# Patient Record
Sex: Female | Born: 1950 | Race: White | Hispanic: No | Marital: Married | Smoking: Never smoker
Health system: Southern US, Community
[De-identification: ages and names within clinical notes are randomized; demographics above are authoritative.]

## PROBLEM LIST (undated history)

## (undated) DIAGNOSIS — L719 Rosacea, unspecified: Secondary | ICD-10-CM

## (undated) DIAGNOSIS — M1990 Unspecified osteoarthritis, unspecified site: Secondary | ICD-10-CM

## (undated) DIAGNOSIS — E7800 Pure hypercholesterolemia, unspecified: Secondary | ICD-10-CM

## (undated) DIAGNOSIS — K219 Gastro-esophageal reflux disease without esophagitis: Secondary | ICD-10-CM

## (undated) DIAGNOSIS — M7070 Other bursitis of hip, unspecified hip: Secondary | ICD-10-CM

## (undated) DIAGNOSIS — M5126 Other intervertebral disc displacement, lumbar region: Secondary | ICD-10-CM

## (undated) DIAGNOSIS — M722 Plantar fascial fibromatosis: Secondary | ICD-10-CM

## (undated) DIAGNOSIS — G20C Parkinsonism, unspecified: Secondary | ICD-10-CM

## (undated) DIAGNOSIS — M51369 Other intervertebral disc degeneration, lumbar region without mention of lumbar back pain or lower extremity pain: Secondary | ICD-10-CM

## (undated) DIAGNOSIS — M797 Fibromyalgia: Secondary | ICD-10-CM

## (undated) DIAGNOSIS — L570 Actinic keratosis: Secondary | ICD-10-CM

## (undated) DIAGNOSIS — E039 Hypothyroidism, unspecified: Secondary | ICD-10-CM

## (undated) DIAGNOSIS — Z79890 Hormone replacement therapy (postmenopausal): Secondary | ICD-10-CM

## (undated) DIAGNOSIS — M5136 Other intervertebral disc degeneration, lumbar region: Secondary | ICD-10-CM

## (undated) HISTORY — DX: Parkinsonism, unspecified: G20.C

## (undated) HISTORY — DX: Unspecified osteoarthritis, unspecified site: M19.90

## (undated) HISTORY — DX: Other bursitis of hip, unspecified hip: M70.70

## (undated) HISTORY — DX: Other intervertebral disc degeneration, lumbar region: M51.36

## (undated) HISTORY — DX: Hormone replacement therapy: Z79.890

## (undated) HISTORY — DX: Other intervertebral disc displacement, lumbar region: M51.26

## (undated) HISTORY — PX: CHOLECYSTECTOMY: SHX55

## (undated) HISTORY — DX: Rosacea, unspecified: L71.9

## (undated) HISTORY — PX: TONSILLECTOMY: SUR1361

## (undated) HISTORY — DX: Actinic keratosis: L57.0

## (undated) HISTORY — DX: Other intervertebral disc degeneration, lumbar region without mention of lumbar back pain or lower extremity pain: M51.369

## (undated) HISTORY — DX: Plantar fascial fibromatosis: M72.2

---

## 2005-07-17 ENCOUNTER — Other Ambulatory Visit: Admission: RE | Admit: 2005-07-17 | Discharge: 2005-07-17 | Payer: Self-pay | Admitting: Obstetrics and Gynecology

## 2007-07-27 ENCOUNTER — Other Ambulatory Visit: Admission: RE | Admit: 2007-07-27 | Discharge: 2007-07-27 | Payer: Self-pay | Admitting: Obstetrics and Gynecology

## 2008-07-30 ENCOUNTER — Other Ambulatory Visit: Admission: RE | Admit: 2008-07-30 | Discharge: 2008-07-30 | Payer: Self-pay | Admitting: Obstetrics and Gynecology

## 2009-07-31 ENCOUNTER — Other Ambulatory Visit: Admission: RE | Admit: 2009-07-31 | Discharge: 2009-07-31 | Payer: Self-pay | Admitting: Obstetrics and Gynecology

## 2010-08-01 ENCOUNTER — Other Ambulatory Visit: Admission: RE | Admit: 2010-08-01 | Discharge: 2010-08-01 | Payer: Self-pay | Admitting: Obstetrics and Gynecology

## 2011-08-18 ENCOUNTER — Other Ambulatory Visit (HOSPITAL_COMMUNITY)
Admission: RE | Admit: 2011-08-18 | Discharge: 2011-08-18 | Disposition: A | Discharge status: Home / Self Care | Payer: PRIVATE HEALTH INSURANCE | Source: Ambulatory Visit | Attending: Obstetrics and Gynecology | Admitting: Obstetrics and Gynecology

## 2011-08-18 DIAGNOSIS — Z01419 Encounter for gynecological examination (general) (routine) without abnormal findings: Secondary | ICD-10-CM | POA: Insufficient documentation

## 2012-08-15 ENCOUNTER — Encounter (INDEPENDENT_AMBULATORY_CARE_PROVIDER_SITE_OTHER): Payer: Self-pay | Admitting: *Deleted

## 2012-08-16 ENCOUNTER — Encounter (INDEPENDENT_AMBULATORY_CARE_PROVIDER_SITE_OTHER): Payer: Self-pay

## 2012-08-22 ENCOUNTER — Other Ambulatory Visit (HOSPITAL_COMMUNITY)
Admission: RE | Admit: 2012-08-22 | Discharge: 2012-08-22 | Disposition: A | Discharge status: Home / Self Care | Payer: PRIVATE HEALTH INSURANCE | Source: Ambulatory Visit | Attending: Obstetrics and Gynecology | Admitting: Obstetrics and Gynecology

## 2012-08-22 DIAGNOSIS — Z1151 Encounter for screening for human papillomavirus (HPV): Secondary | ICD-10-CM | POA: Insufficient documentation

## 2012-08-22 DIAGNOSIS — Z01419 Encounter for gynecological examination (general) (routine) without abnormal findings: Secondary | ICD-10-CM | POA: Insufficient documentation

## 2012-09-20 ENCOUNTER — Other Ambulatory Visit (INDEPENDENT_AMBULATORY_CARE_PROVIDER_SITE_OTHER): Payer: Self-pay | Admitting: *Deleted

## 2012-09-20 ENCOUNTER — Telehealth (INDEPENDENT_AMBULATORY_CARE_PROVIDER_SITE_OTHER): Payer: Self-pay | Admitting: *Deleted

## 2012-09-20 DIAGNOSIS — Z800 Family history of malignant neoplasm of digestive organs: Secondary | ICD-10-CM

## 2012-09-20 DIAGNOSIS — Z1211 Encounter for screening for malignant neoplasm of colon: Secondary | ICD-10-CM

## 2012-09-20 MED ORDER — PEG-KCL-NACL-NASULF-NA ASC-C 100 G PO SOLR: 1.0000 | Freq: Once | ORAL | Status: DC

## 2012-09-20 NOTE — Telephone Encounter (Signed)
Patient needs movi prep 

## 2012-10-12 ENCOUNTER — Telehealth (INDEPENDENT_AMBULATORY_CARE_PROVIDER_SITE_OTHER): Payer: Self-pay | Admitting: *Deleted

## 2012-10-12 NOTE — Telephone Encounter (Signed)
  Procedure: tcs  Reason/Indication:  fam hx colon ca  Has patient had this procedure before?  Yes, 2008 (scanned)  If so, when, by whom and where?    Is there a family history of colon cancer?  Yes, brother  Who?  What age when diagnosed?    Is patient diabetic?   no      Does patient have prosthetic heart valve?  no  Do you have a pacemaker?  no  Has patient had joint replacement within last 12 months?  no  Is patient on Coumadin, Plavix and/or Aspirin? no  Medications: levothyroxine .05 mg daily, zantac 150 mg bid  Allergies: sulfur, crestor  Medication Adjustment:   Procedure date & time: 11/10/12 at 730

## 2012-10-12 NOTE — Telephone Encounter (Signed)
agree

## 2012-11-01 ENCOUNTER — Encounter (HOSPITAL_COMMUNITY): Payer: Self-pay | Admitting: Pharmacy Technician

## 2012-11-10 ENCOUNTER — Encounter (HOSPITAL_COMMUNITY)
Admission: RE | Disposition: A | Discharge status: Home Health | Payer: Self-pay | Source: Ambulatory Visit | Attending: Internal Medicine

## 2012-11-10 ENCOUNTER — Encounter (HOSPITAL_COMMUNITY): Payer: Self-pay

## 2012-11-10 ENCOUNTER — Ambulatory Visit (HOSPITAL_COMMUNITY)
Admission: RE | Admit: 2012-11-10 | Discharge: 2012-11-10 | Disposition: A | Discharge status: Home Health | Payer: PRIVATE HEALTH INSURANCE | Source: Ambulatory Visit | Attending: Internal Medicine | Admitting: Internal Medicine

## 2012-11-10 DIAGNOSIS — Z800 Family history of malignant neoplasm of digestive organs: Secondary | ICD-10-CM

## 2012-11-10 DIAGNOSIS — K644 Residual hemorrhoidal skin tags: Secondary | ICD-10-CM | POA: Insufficient documentation

## 2012-11-10 DIAGNOSIS — K5730 Diverticulosis of large intestine without perforation or abscess without bleeding: Secondary | ICD-10-CM

## 2012-11-10 DIAGNOSIS — E785 Hyperlipidemia, unspecified: Secondary | ICD-10-CM | POA: Insufficient documentation

## 2012-11-10 DIAGNOSIS — Z1211 Encounter for screening for malignant neoplasm of colon: Secondary | ICD-10-CM | POA: Insufficient documentation

## 2012-11-10 HISTORY — DX: Gastro-esophageal reflux disease without esophagitis: K21.9

## 2012-11-10 HISTORY — PX: COLONOSCOPY: SHX5424

## 2012-11-10 HISTORY — DX: Pure hypercholesterolemia, unspecified: E78.00

## 2012-11-10 HISTORY — DX: Hypothyroidism, unspecified: E03.9

## 2012-11-10 HISTORY — DX: Fibromyalgia: M79.7

## 2012-11-10 SURGERY — COLONOSCOPY
Anesthesia: Moderate Sedation

## 2012-11-10 MED ORDER — STERILE WATER FOR IRRIGATION IR SOLN
Status: DC | PRN
  Administered 2012-11-10: 08:00:00

## 2012-11-10 MED ORDER — MEPERIDINE HCL 50 MG/ML IJ SOLN
INTRAMUSCULAR | Status: DC | PRN
  Administered 2012-11-10: 25 mg via INTRAVENOUS

## 2012-11-10 MED ORDER — MEPERIDINE HCL 50 MG/ML IJ SOLN
INTRAMUSCULAR | Status: AC
  Filled 2012-11-10: qty 1

## 2012-11-10 MED ORDER — SODIUM CHLORIDE 0.45 % IV SOLN
INTRAVENOUS | Status: DC
  Administered 2012-11-10: 07:00:00 via INTRAVENOUS

## 2012-11-10 MED ORDER — MIDAZOLAM HCL 5 MG/5ML IJ SOLN
INTRAMUSCULAR | Status: DC | PRN
  Administered 2012-11-10 (×2): 2 mg via INTRAVENOUS
  Administered 2012-11-10: 1 mg via INTRAVENOUS

## 2012-11-10 MED ORDER — MIDAZOLAM HCL 5 MG/5ML IJ SOLN
INTRAMUSCULAR | Status: AC
  Filled 2012-11-10: qty 10

## 2012-11-10 NOTE — H&P (Signed)
Natalie Morrow is an 62 y.o. female.   Chief Complaint: Patient is here for colonoscopy. HPI: Patient is 62 year old Caucasian female who is here for screening colonoscopy. Family history is positive for colon carcinoma in a brother who is 6 at the time of diagnosis and doing well in his 31s. She denies abdominal pain change in her bowel habits or rectal bleeding.  Past Medical History  Diagnosis Date  . Hypothyroidism   . Hypercholesteremia   . GERD (gastroesophageal reflux disease)   . Fibromyalgia     Past Surgical History  Procedure Laterality Date  . Cholecystectomy    . Tonsillectomy      History reviewed. No pertinent family history. Social History:  reports that she has never smoked. She does not have any smokeless tobacco history on file. She reports that she does not drink alcohol or use illicit drugs.  Allergies:  Allergies  Allergen Reactions  . Crestor (Rosuvastatin) Itching  . Sulfa Antibiotics Swelling    Medications Prior to Admission  Medication Sig Dispense Refill  . clonazePAM (KLONOPIN) 0.5 MG tablet Take 0.5 mg by mouth at bedtime as needed (Nerve Pain).      Marland Kitchen estradiol (CLIMARA - DOSED IN MG/24 HR) 0.025 mg/24hr Place 1 patch onto the skin once a week. Changes on Saturday      . ibuprofen (ADVIL,MOTRIN) 200 MG tablet Take 400 mg by mouth every 6 (six) hours as needed for pain.      Marland Kitchen levothyroxine (SYNTHROID, LEVOTHROID) 50 MCG tablet Take 50 mcg by mouth daily.      . peg 3350 powder (MOVIPREP) 100 G SOLR Take 1 kit (100 g total) by mouth once.  1 kit  0  . ranitidine (ZANTAC) 150 MG tablet Take 150 mg by mouth 2 (two) times daily.        No results found for this or any previous visit (from the past 48 hour(s)). No results found.  ROS  Blood pressure 134/78, pulse 109, temperature 98.2 F (36.8 C), temperature source Oral, resp. rate 13, height 5\' 6"  (1.676 m), weight 150 lb (68.04 kg), SpO2 96.00%. Physical Exam  Constitutional: She appears  well-developed and well-nourished.  HENT:  Mouth/Throat: Oropharynx is clear and moist.  Eyes: Conjunctivae are normal. No scleral icterus.  Neck: No thyromegaly present.  Cardiovascular: Normal rate, regular rhythm and normal heart sounds.   No murmur heard. Respiratory: Effort normal and breath sounds normal.  GI: Soft. She exhibits no distension and no mass. There is no tenderness.  Musculoskeletal: She exhibits no edema.  Lymphadenopathy:    She has no cervical adenopathy.  Neurological: She is alert.  Skin: Skin is dry.     Assessment/Plan High-risk screening colonoscopy. Family history of colon carcinoma in a sibling at age 65  Natalie Morrow 11/10/2012, 7:35 AM

## 2012-11-10 NOTE — Op Note (Signed)
COLONOSCOPY PROCEDURE REPORT  PATIENT:  [name]  MR#:  [id number] Birthdate:  [birth year], 62 y.o., female Endoscopist:  Dr. Malissa Hippo, MD Referred By:  Dr. Fara Chute, MD  Procedure Date: 11/10/2012  Procedure:   Colonoscopy  Indications:  Patient is 62 year old Caucasian female who is undergoing high-risk screening colonoscopy. Patient's last exam was in December 2008. Her brother had surgery for colon carcinoma at age 65 and doing fine in his 16s.  Informed Consent:  The procedure and risks were reviewed with the patient and informed consent was obtained.  Medications:  Demerol 25 mg IV Versed 5 mg IV  Description of procedure:  After a digital rectal exam was performed, that colonoscope was advanced from the anus through the rectum and colon to the area of the cecum, ileocecal valve and appendiceal orifice. The cecum was deeply intubated. These structures were well-seen and photographed for the record. From the level of the cecum and ileocecal valve, the scope was slowly and cautiously withdrawn. The mucosal surfaces were carefully surveyed utilizing scope tip to flexion to facilitate fold flattening as needed. The scope was pulled down into the rectum where a thorough exam including retroflexion was performed.  Findings:   Prep excellent. Few small diverticula at sigmoid colon. Normal rectal mucosa. Small hemorrhoids below the dentate line.  Therapeutic/Diagnostic Maneuvers Performed:  None  Complications:  None  Cecal Withdrawal Time:  9 minutes  Impression:  Examination performed to cecum. Few small diverticula at sigmoid colon and external hemorrhoids otherwise normal examination.  Recommendations:  Standard instructions given. Next screening exam in 5 years.  Ziad Maye U  11/10/2012 8:05 AM  CC: Dr. Estanislado Pandy, MD & Dr. Bonnetta Barry ref. provider found

## 2012-11-14 ENCOUNTER — Encounter (HOSPITAL_COMMUNITY): Payer: Self-pay | Admitting: Internal Medicine

## 2013-09-05 ENCOUNTER — Ambulatory Visit (INDEPENDENT_AMBULATORY_CARE_PROVIDER_SITE_OTHER): Payer: PRIVATE HEALTH INSURANCE | Admitting: Adult Health

## 2013-09-05 ENCOUNTER — Encounter: Payer: Self-pay | Admitting: Adult Health

## 2013-09-05 VITALS — BP 120/78 | HR 76 | Ht 66.0 in | Wt 156.0 lb

## 2013-09-05 DIAGNOSIS — Z01419 Encounter for gynecological examination (general) (routine) without abnormal findings: Secondary | ICD-10-CM

## 2013-09-05 DIAGNOSIS — Z79890 Hormone replacement therapy (postmenopausal): Secondary | ICD-10-CM | POA: Insufficient documentation

## 2013-09-05 DIAGNOSIS — Z1212 Encounter for screening for malignant neoplasm of rectum: Secondary | ICD-10-CM

## 2013-09-05 HISTORY — DX: Hormone replacement therapy: Z79.890

## 2013-09-05 LAB — HEMOCCULT GUIAC POC 1CARD (OFFICE)

## 2013-09-05 MED ORDER — ESTRADIOL-LEVONORGESTREL 0.045-0.015 MG/DAY TD PTWK: 1.0000 | MEDICATED_PATCH | TRANSDERMAL | Status: DC

## 2013-09-05 NOTE — Progress Notes (Signed)
Patient ID: Natalie Morrow, female   DOB: [birth year], 62 y.o.   MRN: [id number] History of Present Illness: Natalie Morrow is a 62 year old white female,married in for a physical,she had a normal pap with negative HPV 08/22/12.   Current Medications, Allergies, Past Medical History, Past Surgical History, Family History and Social History were reviewed in [name].   Past Medical History  Diagnosis Date  . Hypothyroidism   . Hypercholesteremia   . GERD (gastroesophageal reflux disease)   . Fibromyalgia   . Hormone replacement therapy (HRT) 09/05/2013   Past Surgical History  Procedure Laterality Date  . Cholecystectomy    . Tonsillectomy    . Colonoscopy N/A 11/10/2012    Procedure: COLONOSCOPY;  Surgeon: Malissa Hippo, MD;  Location: [facility];  Service: Endoscopy;  Laterality: N/A;  730  Current outpatient prescriptions:clonazePAM (KLONOPIN) 0.5 MG tablet, Take 0.5 mg by mouth at bedtime as needed (Nerve Pain)., Disp: , Rfl: ;  estradiol-levonorgestrel (CLIMARAPRO) 0.045-0.015 MG/DAY, Place 1 patch onto the skin once a week., Disp: 4 patch, Rfl: 11;  ibuprofen (ADVIL,MOTRIN) 200 MG tablet, Take 400 mg by mouth every 6 (six) hours as needed for pain., Disp: , Rfl:  levothyroxine (SYNTHROID, LEVOTHROID) 50 MCG tablet, Take 50 mcg by mouth daily., Disp: , Rfl: ;  ranitidine (ZANTAC) 150 MG tablet, Take 150 mg by mouth 2 (two) times daily., Disp: , Rfl:   Review of Systems: Patient denies any headaches, blurred vision, shortness of breath, chest pain, abdominal pain, problems with bowel movements, urination, or intercourse. Does have some vaginal dryness.Has body aches no joint swelling, no mood swings.    Physical Exam:BP 120/78  Pulse 76  Ht 5\' 6"  (1.676 m)  Wt 156 lb (70.761 kg)  BMI 25.19 kg/m2 General:  Well developed, well nourished, no acute distress Skin:  Warm and dry Neck:  Midline trachea, normal thyroid, no carotid bruits Lungs; Clear to  auscultation bilaterally Breast:  No dominant palpable mass, retraction, or nipple discharge Cardiovascular: Regular rate and rhythm Abdomen:  Soft, non tender, no hepatosplenomegaly Pelvic:  External genitalia is normal in appearance.  The vagina is normal in appearance for age.               The cervix is smooth.  Uterus is felt to be normal size, shape, and contour.  No adnexal masses or tenderness noted. Rectal: Good sphincter tone, no polyps, or hemorrhoids felt.  Hemoccult negative. Extremities:  No swelling or varicosities noted Psych:  No mood changes, alert and cooperative, seems happy Discussed try to come off the patch after the holidays but if has hot flashes could stay on longer  Impression: Yearly well woman gyn exam HRT    Plan: Physical in 1 year Mammogram yearly  Colonoscopy 2019 Labs with PCP Try luvena for vaginal moisture

## 2013-09-05 NOTE — Patient Instructions (Signed)
Physical in 1 year Mammogram yearly  Colonoscopy 2019 Labs with PCP Try luvena for vaginal moisture

## 2013-09-08 ENCOUNTER — Other Ambulatory Visit: Payer: Self-pay | Admitting: Adult Health

## 2013-09-20 ENCOUNTER — Encounter: Payer: Self-pay | Admitting: Adult Health

## 2014-07-16 ENCOUNTER — Encounter: Payer: Self-pay | Admitting: Adult Health

## 2014-09-11 ENCOUNTER — Ambulatory Visit (INDEPENDENT_AMBULATORY_CARE_PROVIDER_SITE_OTHER): Payer: PRIVATE HEALTH INSURANCE | Admitting: Adult Health

## 2014-09-11 ENCOUNTER — Encounter: Payer: Self-pay | Admitting: Obstetrics and Gynecology

## 2014-09-11 ENCOUNTER — Encounter: Payer: Self-pay | Admitting: Adult Health

## 2014-09-11 VITALS — BP 118/80 | HR 74 | Ht 66.0 in | Wt 159.0 lb

## 2014-09-11 DIAGNOSIS — Z1212 Encounter for screening for malignant neoplasm of rectum: Secondary | ICD-10-CM

## 2014-09-11 DIAGNOSIS — Z01419 Encounter for gynecological examination (general) (routine) without abnormal findings: Secondary | ICD-10-CM

## 2014-09-11 LAB — HEMOCCULT GUIAC POC 1CARD (OFFICE): Fecal Occult Blood, POC: NEGATIVE

## 2014-09-11 NOTE — Patient Instructions (Signed)
Pap and physical in 1 year Mammogram yearly Labs with PCP 

## 2014-09-11 NOTE — Progress Notes (Signed)
Patient ID: Natalie Morrow, female   DOB: [birth year], 63 y.o.   MRN: [id number] History of Present Illness: Natalie Morrow is a 63 year old white female, married in for a gyn physical.She had a normal pap with negative HPV 08/22/12.She is having pain in back that radiates down left leg and sees Dr Laurian Brim'Toole.She also had some palpitations but they are better now,she saw PCP.   Current Medications, Allergies, Past Medical History, Past Surgical History, Family History and Social History were reviewed in [facility] electronic medical record.     Review of Systems: Patient denies any headaches, blurred vision, shortness of breath, chest pain, abdominal pain, problems with bowel movements, urination, or intercourse. No joint pain or mood swings, she stopped her HRT patch in January.See HPI.    Physical Exam:BP 118/80 mmHg  Pulse 74  Ht 5\' 6"  (1.676 m)  Wt 159 lb (72.122 kg)  BMI 25.68 kg/m2 General:  Well developed, well nourished, no acute distress Skin:  Warm and dry Neck:  Midline trachea, normal thyroid, no carotid bruits heard Lungs; Clear to auscultation bilaterally Breast:  No dominant palpable mass, retraction, or nipple discharge Cardiovascular: Regular rate and rhythm Abdomen:  Soft, non tender, no hepatosplenomegaly Pelvic:  External genitalia is normal in appearance, no lesions.  The vagina has loss of color, moisture and rugae.  The cervix is smooth.  Uterus is felt to be normal size, shape, and contour.  No                adnexal masses or tenderness noted. Rectal: Good sphincter tone, no polyps, or hemorrhoids felt.  Hemoccult negative. Extremities:  No swelling or varicosities noted Psych:  No mood changes,alert and cooperative,seems happy   Impression: Well woman gyn exam no pap    Plan: Pap and physical in 1 year Mammogram yearly Labs with PCP Keep Dr Laurian Brim'Toole aware of any changes with leg and back, and if has any bowel or bladder issues

## 2014-09-24 ENCOUNTER — Ambulatory Visit: Payer: PRIVATE HEALTH INSURANCE | Admitting: Obstetrics and Gynecology

## 2014-09-24 ENCOUNTER — Other Ambulatory Visit: Payer: PRIVATE HEALTH INSURANCE

## 2015-05-16 HISTORY — PX: BACK SURGERY: SHX140

## 2015-09-17 ENCOUNTER — Other Ambulatory Visit (HOSPITAL_COMMUNITY)
Admission: RE | Admit: 2015-09-17 | Discharge: 2015-09-17 | Disposition: A | Discharge status: Home / Self Care | Payer: PRIVATE HEALTH INSURANCE | Source: Ambulatory Visit | Attending: Adult Health | Admitting: Adult Health

## 2015-09-17 ENCOUNTER — Ambulatory Visit (INDEPENDENT_AMBULATORY_CARE_PROVIDER_SITE_OTHER): Payer: PRIVATE HEALTH INSURANCE | Admitting: Adult Health

## 2015-09-17 ENCOUNTER — Encounter: Payer: Self-pay | Admitting: Adult Health

## 2015-09-17 VITALS — BP 112/78 | HR 92 | Ht 65.5 in | Wt 157.0 lb

## 2015-09-17 DIAGNOSIS — Z1151 Encounter for screening for human papillomavirus (HPV): Secondary | ICD-10-CM | POA: Diagnosis present

## 2015-09-17 DIAGNOSIS — Z1212 Encounter for screening for malignant neoplasm of rectum: Secondary | ICD-10-CM | POA: Diagnosis not present

## 2015-09-17 DIAGNOSIS — Z01419 Encounter for gynecological examination (general) (routine) without abnormal findings: Secondary | ICD-10-CM

## 2015-09-17 DIAGNOSIS — L570 Actinic keratosis: Secondary | ICD-10-CM

## 2015-09-17 HISTORY — DX: Actinic keratosis: L57.0

## 2015-09-17 LAB — HEMOCCULT GUIAC POC 1CARD (OFFICE): Fecal Occult Blood, POC: NEGATIVE

## 2015-09-17 NOTE — Patient Instructions (Signed)
Mammogram yearly Physical in 1 year Colonoscopy per Dr Burton Apleyehman Labs with PCP

## 2015-09-17 NOTE — Progress Notes (Signed)
Patient ID: Stephan MinisterFrances H Doucet, female   DOB: Jun 10, 1951, 65 y.o.   MRN: [id number] History of Present Illness: Scarlette CalicoFrances is a 65 year old white female,married in for a well woman gyn exam and pap, she had back surgery in September by Dr Trey SailorsMark Roy.Her PCP is Dr Neita CarpSasser.   Current Medications, Allergies, Past Medical History, Past Surgical History, Family History and Social History were reviewed in [facility] electronic medical record.     Review of Systems: Patient denies any headaches, hearing loss, fatigue, blurred vision, shortness of breath, chest pain, abdominal pain, problems with bowel movements, urination, or intercourse. No joint pain or mood swings.She has some trouble sleeping at times, had before surgery due to pain,but now it is harder to get to sleep.    Physical Exam:BP 112/78 mmHg  Pulse 92  Ht 5' 5.5" (1.664 m)  Wt 157 lb (71.215 kg)  BMI 25.72 kg/m2 General:  Well developed, well nourished, no acute distress Skin:  Warm and dry,has numerous AKs Neck:  Midline trachea, normal thyroid, good ROM, no lymphadenopathy, no carotid bruits heard Lungs; Clear to auscultation bilaterally Breast:  No dominant palpable mass, retraction, or nipple discharge Cardiovascular: Regular rate and rhythm Abdomen:  Soft, non tender, no hepatosplenomegaly Pelvic:  External genitalia is normal in appearance, no lesions.  The vagina has decreased color, moisture and rugae. Urethra has no lesions or masses. The cervix is atrophic, pap with HPV performed.Marland Kitchen.  Uterus is felt to be normal size, shape, and contour.  No adnexal masses or tenderness noted.Bladder is non tender, no masses felt. Rectal: Good sphincter tone, no polyps, or hemorrhoids felt.  Hemoccult negative. Extremities/musculoskeletal:  No swelling or varicosities noted, no clubbing or cyanosis Psych:  No mood changes, alert and cooperative,seems happy   Impression: Well woman gyn exam with pap AKs    Plan: Try melatonin 10 mg at  bedtime Physical in 1 year, pap in 3 if normal  Mammogram yearly Labs with PCP  Colonoscopy per Dr Karilyn Cotaehman, had in 2014 due 2019  See Dr Orvan FalconerBeavers about AKs

## 2015-09-19 LAB — CYTOLOGY - PAP

## 2016-03-05 ENCOUNTER — Encounter: Payer: Self-pay | Admitting: Obstetrics and Gynecology

## 2016-09-24 ENCOUNTER — Other Ambulatory Visit: Payer: Self-pay | Admitting: Adult Health

## 2016-09-24 DIAGNOSIS — Z1231 Encounter for screening mammogram for malignant neoplasm of breast: Secondary | ICD-10-CM

## 2016-10-01 ENCOUNTER — Ambulatory Visit (HOSPITAL_COMMUNITY): Payer: PRIVATE HEALTH INSURANCE

## 2016-10-07 ENCOUNTER — Encounter: Payer: Self-pay | Admitting: Adult Health

## 2016-10-07 ENCOUNTER — Telehealth: Payer: Self-pay | Admitting: Adult Health

## 2016-10-07 ENCOUNTER — Ambulatory Visit (INDEPENDENT_AMBULATORY_CARE_PROVIDER_SITE_OTHER): Payer: PRIVATE HEALTH INSURANCE | Admitting: Adult Health

## 2016-10-07 ENCOUNTER — Ambulatory Visit (HOSPITAL_COMMUNITY)
Admission: RE | Admit: 2016-10-07 | Discharge: 2016-10-07 | Disposition: A | Discharge status: Home / Self Care | Payer: PRIVATE HEALTH INSURANCE | Source: Ambulatory Visit | Attending: Adult Health | Admitting: Adult Health

## 2016-10-07 VITALS — BP 104/60 | HR 78 | Ht 65.25 in | Wt 144.0 lb

## 2016-10-07 DIAGNOSIS — N898 Other specified noninflammatory disorders of vagina: Secondary | ICD-10-CM

## 2016-10-07 DIAGNOSIS — Z1212 Encounter for screening for malignant neoplasm of rectum: Secondary | ICD-10-CM

## 2016-10-07 DIAGNOSIS — Z1211 Encounter for screening for malignant neoplasm of colon: Secondary | ICD-10-CM | POA: Diagnosis not present

## 2016-10-07 DIAGNOSIS — Z1231 Encounter for screening mammogram for malignant neoplasm of breast: Secondary | ICD-10-CM | POA: Diagnosis present

## 2016-10-07 DIAGNOSIS — Z01419 Encounter for gynecological examination (general) (routine) without abnormal findings: Secondary | ICD-10-CM

## 2016-10-07 DIAGNOSIS — L570 Actinic keratosis: Secondary | ICD-10-CM

## 2016-10-07 DIAGNOSIS — Z01411 Encounter for gynecological examination (general) (routine) with abnormal findings: Secondary | ICD-10-CM | POA: Diagnosis not present

## 2016-10-07 LAB — HEMOCCULT GUIAC POC 1CARD (OFFICE): FECAL OCCULT BLD: NEGATIVE

## 2016-10-07 NOTE — Telephone Encounter (Signed)
Left message that mammogram was good

## 2016-10-07 NOTE — Progress Notes (Signed)
Patient ID: Natalie Morrow, female   DOB: 03/23/1951, 66 y.o.   MRN: [id number] History of Present Illness: Natalie Morrow is a 66 year old white female, married in for well woman gyn exam, she had normal pap with negative HPV 09/17/15. PCP is Dr Neita CarpSasser.  Current Medications, Allergies, Past Medical History, Past Surgical History, Family History and Social History were reviewed in [facility] electronic medical record.     Review of Systems: Patient denies any headaches, hearing loss, fatigue, blurred vision, shortness of breath, chest pain, abdominal pain, problems with bowel movements, urination(leaks at times), or intercourse(burns some). No joint pain(has plantar fascitis)  or mood swings.    Physical Exam:BP 104/60 (BP Location: Left Arm, Patient Position: Sitting, Cuff Size: Normal)   Pulse 78   Ht 5' 5.25" (1.657 m)   Wt 144 lb (65.3 kg)   BMI 23.78 kg/m  General:  Well developed, well nourished, no acute distress Skin:  Warm and dry,+AKs Neck:  Midline trachea, normal thyroid, good ROM, no lymphadenopathy,no carotid bruits heard Lungs; Clear to auscultation bilaterally Breast:  No dominant palpable mass, retraction, or nipple discharge Cardiovascular: Regular rate and rhythm Abdomen:  Soft, non tender, no hepatosplenomegaly Pelvic:  External genitalia is normal in appearance, no lesions.  The vagina is pale with loss of moisture and rugae. Urethra has no lesions or masses. The cervix is smooth.  Uterus is felt to be normal size, shape, and contour.  No adnexal masses or tenderness noted.Bladder is non tender, no masses felt. Rectal: Good sphincter tone, no polyps, or hemorrhoids felt.  Hemoccult negative. Extremities/musculoskeletal:  No swelling or varicosities noted, no clubbing or cyanosis Psych:  No mood changes, alert and cooperative,seems happy PHQ 2 score 0  Impression: 1. Well female exam with routine gynecological exam   2. AK (actinic keratosis)   3. Vaginal dryness    4. Screening for colorectal cancer       Plan:  Physical in 1 year Pap in 2020 Mammogram yearly Labs with PCP Talk with PCP about 81 mg baby ASA daily and take centrum silver daily Try luvena Colonoscopy per GI She has had flu shot, and pneumonia shot and got shingles shot at 59 encouraged to get new shingles shot.She has had DEXA with Dr Trey SailorsMark Roy, may have osteopenia.

## 2017-08-24 ENCOUNTER — Other Ambulatory Visit: Payer: Self-pay | Admitting: Adult Health

## 2017-08-24 DIAGNOSIS — Z1231 Encounter for screening mammogram for malignant neoplasm of breast: Secondary | ICD-10-CM

## 2017-10-12 ENCOUNTER — Ambulatory Visit (INDEPENDENT_AMBULATORY_CARE_PROVIDER_SITE_OTHER): Payer: Commercial Managed Care - PPO | Admitting: Adult Health

## 2017-10-12 ENCOUNTER — Other Ambulatory Visit: Payer: Self-pay

## 2017-10-12 ENCOUNTER — Encounter: Payer: Self-pay | Admitting: Adult Health

## 2017-10-12 VITALS — BP 118/66 | HR 97 | Ht 66.0 in | Wt 139.0 lb

## 2017-10-12 DIAGNOSIS — N816 Rectocele: Secondary | ICD-10-CM

## 2017-10-12 DIAGNOSIS — Z1212 Encounter for screening for malignant neoplasm of rectum: Secondary | ICD-10-CM

## 2017-10-12 DIAGNOSIS — Z1211 Encounter for screening for malignant neoplasm of colon: Secondary | ICD-10-CM

## 2017-10-12 DIAGNOSIS — Z01419 Encounter for gynecological examination (general) (routine) without abnormal findings: Secondary | ICD-10-CM | POA: Diagnosis not present

## 2017-10-12 DIAGNOSIS — L570 Actinic keratosis: Secondary | ICD-10-CM | POA: Diagnosis not present

## 2017-10-12 DIAGNOSIS — N898 Other specified noninflammatory disorders of vagina: Secondary | ICD-10-CM | POA: Diagnosis not present

## 2017-10-12 HISTORY — DX: Other specified noninflammatory disorders of vagina: N89.8

## 2017-10-12 HISTORY — DX: Rectocele: N81.6

## 2017-10-12 LAB — HEMOCCULT GUIAC POC 1CARD (OFFICE): FECAL OCCULT BLD: NEGATIVE

## 2017-10-12 NOTE — Progress Notes (Signed)
Patient ID: Natalie Morrow, female   DOB: [birth year], 67 y.o.   MRN: [id number] History of Present Illness: Natalie CalicoFrances is a 67 year old white female, married, PM in for well woman gyn exam, she had normal pap with negative 09/17/15. PCP is Dr Neita CarpSasser.    Current Medications, Allergies, Past Medical History, Past Surgical History, Family History and Social History were reviewed in [facility] electronic medical record.     Review of Systems:  Patient denies any headaches, hearing loss, fatigue, blurred vision, shortness of breath, chest pain, abdominal pain, problems with bowel movements, urination, or intercourse. No joint pain or mood swings. +vaginal dryness, uses luvena and astro glide,has brown patch on face,that burns at times     Physical Exam:BP 118/66 (BP Location: Right Arm, Patient Position: Sitting, Cuff Size: Normal)   Pulse 97   Ht 5\' 6"  (1.676 m)   Wt 139 lb (63 kg)   BMI 22.44 kg/m  General:  Well developed, well nourished, no acute distress Skin:  Warm and dry,has 2 brown patches on cheeks, R>L, has seen dermatologist,has Aks on back  Neck:  Midline trachea, normal thyroid, good ROM, no lymphadenopathy,no carotid bruits heard Lungs; Clear to auscultation bilaterally Breast:  No dominant palpable mass, retraction, or nipple discharge Cardiovascular: Regular rate and rhythm Abdomen:  Soft, non tender, no hepatosplenomegaly Pelvic:  External genitalia is normal in appearance, no lesions.  The vagina is pale with loss of moisture and rugae. Urethra has no lesions or masses. The cervix is smooth.  Uterus is felt to be normal size, shape, and contour.  No adnexal masses or tenderness noted.Bladder is non tender, no masses felt. Rectal: Good sphincter tone, no polyps, or hemorrhoids felt.  Hemoccult negative.+rectocele Extremities/musculoskeletal:  No swelling or varicosities noted, no clubbing or cyanosis Psych:  No mood changes, alert and cooperative,seems happy PHQ 2  score 0.  Impression: 1. Well female exam with routine gynecological exam   2. Screening for colorectal cancer   3. Vaginal dryness   4. Rectocele   5. AK (actinic keratosis)       Plan: Pap and physical in 1 year Mammogram yearly F/U with dermatologist

## 2017-10-14 ENCOUNTER — Ambulatory Visit (HOSPITAL_COMMUNITY): Payer: PRIVATE HEALTH INSURANCE

## 2017-10-21 ENCOUNTER — Ambulatory Visit (HOSPITAL_COMMUNITY)
Admission: RE | Admit: 2017-10-21 | Discharge: 2017-10-21 | Disposition: A | Discharge status: Home / Self Care | Payer: Commercial Managed Care - PPO | Source: Ambulatory Visit | Attending: Adult Health | Admitting: Adult Health

## 2017-10-21 DIAGNOSIS — Z1231 Encounter for screening mammogram for malignant neoplasm of breast: Secondary | ICD-10-CM | POA: Diagnosis not present

## 2017-12-01 ENCOUNTER — Encounter (INDEPENDENT_AMBULATORY_CARE_PROVIDER_SITE_OTHER): Payer: Self-pay | Admitting: *Deleted

## 2018-02-16 ENCOUNTER — Other Ambulatory Visit (INDEPENDENT_AMBULATORY_CARE_PROVIDER_SITE_OTHER): Payer: Self-pay | Admitting: *Deleted

## 2018-02-16 DIAGNOSIS — Z800 Family history of malignant neoplasm of digestive organs: Secondary | ICD-10-CM

## 2018-02-17 ENCOUNTER — Other Ambulatory Visit (INDEPENDENT_AMBULATORY_CARE_PROVIDER_SITE_OTHER): Payer: Self-pay | Admitting: *Deleted

## 2018-02-17 ENCOUNTER — Encounter (INDEPENDENT_AMBULATORY_CARE_PROVIDER_SITE_OTHER): Payer: Self-pay | Admitting: *Deleted

## 2018-02-17 ENCOUNTER — Telehealth (INDEPENDENT_AMBULATORY_CARE_PROVIDER_SITE_OTHER): Payer: Self-pay | Admitting: *Deleted

## 2018-02-17 DIAGNOSIS — Z800 Family history of malignant neoplasm of digestive organs: Secondary | ICD-10-CM

## 2018-02-17 HISTORY — DX: Family history of malignant neoplasm of digestive organs: Z80.0

## 2018-02-17 NOTE — Telephone Encounter (Signed)
Patient needs trilyte 

## 2018-02-21 MED ORDER — PEG 3350-KCL-NA BICARB-NACL 420 G PO SOLR: 4000.0000 mL | Freq: Once | ORAL | 0 refills | Status: AC

## 2018-03-10 ENCOUNTER — Telehealth (INDEPENDENT_AMBULATORY_CARE_PROVIDER_SITE_OTHER): Payer: Self-pay | Admitting: *Deleted

## 2018-03-10 NOTE — Telephone Encounter (Signed)
Referring MD/PCP: sasser   Procedure: tcs with propofol  Reason/Indication:  fam hx colon ca  Has patient had this procedure before?  Yes, 2014  If so, when, by whom and where?    Is there a family history of colon cancer?  Yes, brother  Who?  What age when diagnosed?    Is patient diabetic?   no      Does patient have prosthetic heart valve or mechanical valve?  no  Do you have a pacemaker?  no  Has patient ever had endocarditis? no  Has patient had joint replacement within last 12 months?  no  Is patient constipated or do they take laxatives? no  Does patient have a history of alcohol/drug use?  no  Is patient on blood thinner such as Coumadin, Plavix and/or Aspirin? no  Medications: levothyroxine 50 mcg daily, doxycycline 50 mg daily, metronidazole 0.75% cream, clonazepam 0.5 mg 1/2 tab at bedtime, tylenol, Claritin or rantidine    Allergies: sulfur, statins  Medication Adjustment per Dr Keane Policeehman/Terri Setzer, NP:   Procedure date & time: 04/08/18 at 1:00

## 2018-03-10 NOTE — Telephone Encounter (Signed)
agree

## 2018-04-01 ENCOUNTER — Other Ambulatory Visit (HOSPITAL_COMMUNITY): Payer: Commercial Managed Care - PPO

## 2018-04-01 MED ORDER — CHLORHEXIDINE GLUCONATE CLOTH 2 % EX PADS: 6.0000 | MEDICATED_PAD | Freq: Once | CUTANEOUS | Status: DC

## 2018-04-01 NOTE — Patient Instructions (Signed)
Natalie Morrow  04/01/2018     @PREFPERIOPPHARMACY @   Your procedure is scheduled on  04/08/2018 .  Report to [facility] at  1130   A.M.  Call this number if you have problems the morning of surgery:  [phone]   Remember:  Do not eat or drink after midnight.  You may drink clear liquids until ( follow the diet and prep instructions given to you by Dr Patty Sermons office) .  Clear liquids allowed are:                    Water, Juice (non-citric and without pulp), Carbonated beverages, Clear Tea, Black Coffee only, Plain Jell-O only, Gatorade and Plain Popsicles only    Take these medicines the morning of surgery with A SIP OF WATER  Levothyroxine, claritin, zantac.    Do not wear jewelry, make-up or nail polish.  Do not wear lotions, powders, or perfumes, or deodorant.  Do not shave 48 hours prior to surgery.  Men may shave face and neck.  Do not bring valuables to the hospital.  [facility] is not responsible for any belongings or valuables.  Contacts, dentures or bridgework may not be worn into surgery.  Leave your suitcase in the car.  After surgery it may be brought to your room.  For patients admitted to the hospital, discharge time will be determined by your treatment team.  Patients discharged the day of surgery will not be allowed to drive home.   Name and phone number of your driver:   family Special instructions:  Follow the diet and prep instructions given to you by Dr Patty Sermons office.  Please read over the following fact sheets that you were given. Anesthesia Post-op Instructions and Care and Recovery After Surgery       Colonoscopy, Adult A colonoscopy is an exam to look at the large intestine. It is done to check for problems, such as:  Lumps (tumors).  Growths (polyps).  Swelling (inflammation).  Bleeding.  What happens before the procedure? Eating and drinking Follow instructions from your doctor about eating and drinking. These  instructions may include:  A few days before the procedure - follow a low-fiber diet. ? Avoid nuts. ? Avoid seeds. ? Avoid dried fruit. ? Avoid raw fruits. ? Avoid vegetables.  1-3 days before the procedure - follow a clear liquid diet. Avoid liquids that have red or purple dye. Drink only clear liquids, such as: ? Clear broth or bouillon. ? Black coffee or tea. ? Clear juice. ? Clear soft drinks or sports drinks. ? Gelatin dessert. ? Popsicles.  On the day of the procedure - do not eat or drink anything during the 2 hours before the procedure.  Bowel prep If you were prescribed an oral bowel prep:  Take it as told by your doctor. Starting the day before your procedure, you will need to drink a lot of liquid. The liquid will cause you to poop (have bowel movements) until your poop is almost clear or light green.  If your skin or butt gets irritated from diarrhea, you may: ? Wipe the area with wipes that have medicine in them, such as adult wet wipes with aloe and vitamin E. ? Put something on your skin that soothes the area, such as petroleum jelly.  If you throw up (vomit) while drinking the bowel prep, take a break for up to 60 minutes. Then begin the  bowel prep again. If you keep throwing up and you cannot take the bowel prep without throwing up, call your doctor.  General instructions  Ask your doctor about changing or stopping your normal medicines. This is important if you take diabetes medicines or blood thinners.  Plan to have someone take you home from the hospital or clinic. What happens during the procedure?  An IV tube may be put into one of your veins.  You will be given medicine to help you relax (sedative).  To reduce your risk of infection: ? Your doctors will wash their hands. ? Your anal area will be washed with soap.  You will be asked to lie on your side with your knees bent.  Your doctor will get a long, thin, flexible tube ready. The tube will have  a camera and a light on the end.  The tube will be put into your anus.  The tube will be gently put into your large intestine.  Air will be delivered into your large intestine to keep it open. You may feel some pressure or cramping.  The camera will be used to take photos.  A small tissue sample may be removed from your body to be looked at under a microscope (biopsy). If any possible problems are found, the tissue will be sent to a lab for testing.  If small growths are found, your doctor may remove them and have them checked for cancer.  The tube that was put into your anus will be slowly removed. The procedure may vary among doctors and hospitals. What happens after the procedure?  Your doctor will check on you often until the medicines you were given have worn off.  Do not drive for 24 hours after the procedure.  You may have a small amount of blood in your poop.  You may pass gas.  You may have mild cramps or bloating in your belly (abdomen).  It is up to you to get the results of your procedure. Ask your doctor, or the department performing the procedure, when your results will be ready. This information is not intended to replace advice given to you by your health care provider. Make sure you discuss any questions you have with your health care provider. Document Released: 10/03/2010 Document Revised: 07/01/2016 Document Reviewed: 11/12/2015 Elsevier Interactive Patient Education  2017 [employer]  Colonoscopy, Adult, Care After This sheet gives you information about how to care for yourself after your procedure. Your health care provider may also give you more specific instructions. If you have problems or questions, contact your health care provider. What can I expect after the procedure? After the procedure, it is common to have:  A small amount of blood in your stool for 24 hours after the procedure.  Some gas.  Mild abdominal cramping or bloating.  Follow  these instructions at home: General instructions   For the first 24 hours after the procedure: ? Do not drive or use machinery. ? Do not sign important documents. ? Do not drink alcohol. ? Do your regular daily activities at a slower pace than normal. ? Eat soft, easy-to-digest foods. ? Rest often.  Take over-the-counter or prescription medicines only as told by your health care provider.  It is up to you to get the results of your procedure. Ask your health care provider, or the department performing the procedure, when your results will be ready. Relieving cramping and bloating  Try walking around when you have cramps or feel  bloated.  Apply heat to your abdomen as told by your health care provider. Use a heat source that your health care provider recommends, such as a moist heat pack or a heating pad. ? Place a towel between your skin and the heat source. ? Leave the heat on for 20-30 minutes. ? Remove the heat if your skin turns bright red. This is especially important if you are unable to feel pain, heat, or cold. You may have a greater risk of getting burned. Eating and drinking  Drink enough fluid to keep your urine clear or pale yellow.  Resume your normal diet as instructed by your health care provider. Avoid heavy or fried foods that are hard to digest.  Avoid drinking alcohol for as long as instructed by your health care provider. Contact a health care provider if:  You have blood in your stool 2-3 days after the procedure. Get help right away if:  You have more than a small spotting of blood in your stool.  You pass large blood clots in your stool.  Your abdomen is swollen.  You have nausea or vomiting.  You have a fever.  You have increasing abdominal pain that is not relieved with medicine. This information is not intended to replace advice given to you by your health care provider. Make sure you discuss any questions you have with your health care  provider. Document Released: 04/14/2004 Document Revised: 05/25/2016 Document Reviewed: 11/12/2015 Elsevier Interactive Patient Education  2018 [employer]  Monitored Anesthesia Care Anesthesia is a term that refers to techniques, procedures, and medicines that help a person stay safe and comfortable during a medical procedure. Monitored anesthesia care, or sedation, is one type of anesthesia. Your anesthesia specialist may recommend sedation if you will be having a procedure that does not require you to be unconscious, such as:  Cataract surgery.  A dental procedure.  A biopsy.  A colonoscopy.  During the procedure, you may receive a medicine to help you relax (sedative). There are three levels of sedation:  Mild sedation. At this level, you may feel awake and relaxed. You will be able to follow directions.  Moderate sedation. At this level, you will be sleepy. You may not remember the procedure.  Deep sedation. At this level, you will be asleep. You will not remember the procedure.  The more medicine you are given, the deeper your level of sedation will be. Depending on how you respond to the procedure, the anesthesia specialist may change your level of sedation or the type of anesthesia to fit your needs. An anesthesia specialist will monitor you closely during the procedure. Let your health care provider know about:  Any allergies you have.  All medicines you are taking, including vitamins, herbs, eye drops, creams, and over-the-counter medicines.  Any use of steroids (by mouth or as a cream).  Any problems you or family members have had with sedatives and anesthetic medicines.  Any blood disorders you have.  Any surgeries you have had.  Any medical conditions you have, such as sleep apnea.  Whether you are pregnant or may be pregnant.  Any use of cigarettes, alcohol, or street drugs. What are the risks? Generally, this is a safe procedure. However, problems may  occur, including:  Getting too much medicine (oversedation).  Nausea.  Allergic reaction to medicines.  Trouble breathing. If this happens, a breathing tube may be used to help with breathing. It will be removed when you are awake and breathing on  your own.  Heart trouble.  Lung trouble.  Before the procedure Staying hydrated Follow instructions from your health care provider about hydration, which may include:  Up to 2 hours before the procedure - you may continue to drink clear liquids, such as water, clear fruit juice, black coffee, and plain tea.  Eating and drinking restrictions Follow instructions from your health care provider about eating and drinking, which may include:  8 hours before the procedure - stop eating heavy meals or foods such as meat, fried foods, or fatty foods.  6 hours before the procedure - stop eating light meals or foods, such as toast or cereal.  6 hours before the procedure - stop drinking milk or drinks that contain milk.  2 hours before the procedure - stop drinking clear liquids.  Medicines Ask your health care provider about:  Changing or stopping your regular medicines. This is especially important if you are taking diabetes medicines or blood thinners.  Taking medicines such as aspirin and ibuprofen. These medicines can thin your blood. Do not take these medicines before your procedure if your health care provider instructs you not to.  Tests and exams  You will have a physical exam.  You may have blood tests done to show: ? How well your kidneys and liver are working. ? How well your blood can clot.  General instructions  Plan to have someone take you home from the hospital or clinic.  If you will be going home right after the procedure, plan to have someone with you for 24 hours.  What happens during the procedure?  Your blood pressure, heart rate, breathing, level of pain and overall condition will be monitored.  An IV  tube will be inserted into one of your veins.  Your anesthesia specialist will give you medicines as needed to keep you comfortable during the procedure. This may mean changing the level of sedation.  The procedure will be performed. After the procedure  Your blood pressure, heart rate, breathing rate, and blood oxygen level will be monitored until the medicines you were given have worn off.  Do not drive for 24 hours if you received a sedative.  You may: ? Feel sleepy, clumsy, or nauseous. ? Feel forgetful about what happened after the procedure. ? Have a sore throat if you had a breathing tube during the procedure. ? Vomit. This information is not intended to replace advice given to you by your health care provider. Make sure you discuss any questions you have with your health care provider. Document Released: 05/27/2005 Document Revised: 02/07/2016 Document Reviewed: 12/22/2015 Elsevier Interactive Patient Education  2018 [employer] Monitored Anesthesia Care, Care After These instructions provide you with information about caring for yourself after your procedure. Your health care provider may also give you more specific instructions. Your treatment has been planned according to current medical practices, but problems sometimes occur. Call your health care provider if you have any problems or questions after your procedure. What can I expect after the procedure? After your procedure, it is common to:  Feel sleepy for several hours.  Feel clumsy and have poor balance for several hours.  Feel forgetful about what happened after the procedure.  Have poor judgment for several hours.  Feel nauseous or vomit.  Have a sore throat if you had a breathing tube during the procedure.  Follow these instructions at home: For at least 24 hours after the procedure:   Do not: ? Participate in activities in which  you could fall or become injured. ? Drive. ? Use heavy  machinery. ? Drink alcohol. ? Take sleeping pills or medicines that cause drowsiness. ? Make important decisions or sign legal documents. ? Take care of children on your own.  Rest. Eating and drinking  Follow the diet that is recommended by your health care provider.  If you vomit, drink water, juice, or soup when you can drink without vomiting.  Make sure you have little or no nausea before eating solid foods. General instructions  Have a responsible adult stay with you until you are awake and alert.  Take over-the-counter and prescription medicines only as told by your health care provider.  If you smoke, do not smoke without supervision.  Keep all follow-up visits as told by your health care provider. This is important. Contact a health care provider if:  You keep feeling nauseous or you keep vomiting.  You feel light-headed.  You develop a rash.  You have a fever. Get help right away if:  You have trouble breathing. This information is not intended to replace advice given to you by your health care provider. Make sure you discuss any questions you have with your health care provider. Document Released: 12/22/2015 Document Revised: 04/22/2016 Document Reviewed: 12/22/2015 Elsevier Interactive Patient Education  [employer].

## 2018-04-04 ENCOUNTER — Other Ambulatory Visit: Payer: Self-pay

## 2018-04-04 ENCOUNTER — Encounter (HOSPITAL_COMMUNITY)
Admission: RE | Admit: 2018-04-04 | Discharge: 2018-04-04 | Disposition: A | Discharge status: Home / Self Care | Payer: Commercial Managed Care - PPO | Source: Ambulatory Visit | Attending: Internal Medicine | Admitting: Internal Medicine

## 2018-04-04 ENCOUNTER — Encounter (HOSPITAL_COMMUNITY): Payer: Self-pay

## 2018-04-04 DIAGNOSIS — Z01818 Encounter for other preprocedural examination: Secondary | ICD-10-CM | POA: Diagnosis not present

## 2018-04-04 DIAGNOSIS — Z800 Family history of malignant neoplasm of digestive organs: Secondary | ICD-10-CM | POA: Diagnosis not present

## 2018-04-04 DIAGNOSIS — Z01810 Encounter for preprocedural cardiovascular examination: Secondary | ICD-10-CM | POA: Diagnosis present

## 2018-04-04 LAB — CBC WITH DIFFERENTIAL/PLATELET
Basophils Absolute: 0 10*3/uL (ref 0.0–0.1)
Basophils Relative: 0 %
EOS ABS: 0.2 10*3/uL (ref 0.0–0.7)
Eosinophils Relative: 3 %
HCT: 43.5 % (ref 36.0–46.0)
HEMOGLOBIN: 14.7 g/dL (ref 12.0–15.0)
LYMPHS ABS: 2.3 10*3/uL (ref 0.7–4.0)
LYMPHS PCT: 35 %
MCH: 31.1 pg (ref 26.0–34.0)
MCHC: 33.8 g/dL (ref 30.0–36.0)
MCV: 92 fL (ref 78.0–100.0)
Monocytes Absolute: 0.6 10*3/uL (ref 0.1–1.0)
Monocytes Relative: 8 %
NEUTROS PCT: 54 %
Neutro Abs: 3.6 10*3/uL (ref 1.7–7.7)
Platelets: 277 10*3/uL (ref 150–400)
RBC: 4.73 MIL/uL (ref 3.87–5.11)
RDW: 12.5 % (ref 11.5–15.5)
WBC: 6.7 10*3/uL (ref 4.0–10.5)

## 2018-04-04 LAB — BASIC METABOLIC PANEL
Anion gap: 9 (ref 5–15)
BUN: 15 mg/dL (ref 8–23)
CHLORIDE: 109 mmol/L (ref 98–111)
CO2: 24 mmol/L (ref 22–32)
Calcium: 8.9 mg/dL (ref 8.9–10.3)
Creatinine, Ser: 0.85 mg/dL (ref 0.44–1.00)
GFR calc Af Amer: >60 mL/min (ref >60)
GFR calc non Af Amer: >60 mL/min (ref >60)
Glucose, Bld: 97 mg/dL (ref 70–99)
POTASSIUM: 3.9 mmol/L (ref 3.5–5.1)
SODIUM: 142 mmol/L (ref 135–145)

## 2018-04-08 ENCOUNTER — Encounter (HOSPITAL_COMMUNITY): Payer: Self-pay

## 2018-04-08 ENCOUNTER — Ambulatory Visit (HOSPITAL_COMMUNITY)
Admission: RE | Admit: 2018-04-08 | Discharge: 2018-04-08 | Disposition: A | Discharge status: Home / Self Care | Payer: Commercial Managed Care - PPO | Source: Ambulatory Visit | Attending: Internal Medicine | Admitting: Internal Medicine

## 2018-04-08 ENCOUNTER — Encounter (HOSPITAL_COMMUNITY)
Admission: RE | Disposition: A | Discharge status: Home / Self Care | Payer: Self-pay | Source: Ambulatory Visit | Attending: Internal Medicine

## 2018-04-08 ENCOUNTER — Ambulatory Visit (HOSPITAL_COMMUNITY): Payer: Commercial Managed Care - PPO | Admitting: Anesthesiology

## 2018-04-08 ENCOUNTER — Other Ambulatory Visit: Payer: Self-pay

## 2018-04-08 DIAGNOSIS — E7800 Pure hypercholesterolemia, unspecified: Secondary | ICD-10-CM | POA: Diagnosis not present

## 2018-04-08 DIAGNOSIS — E039 Hypothyroidism, unspecified: Secondary | ICD-10-CM | POA: Diagnosis not present

## 2018-04-08 DIAGNOSIS — Z882 Allergy status to sulfonamides status: Secondary | ICD-10-CM | POA: Insufficient documentation

## 2018-04-08 DIAGNOSIS — K219 Gastro-esophageal reflux disease without esophagitis: Secondary | ICD-10-CM | POA: Insufficient documentation

## 2018-04-08 DIAGNOSIS — Z1211 Encounter for screening for malignant neoplasm of colon: Secondary | ICD-10-CM | POA: Diagnosis present

## 2018-04-08 DIAGNOSIS — K644 Residual hemorrhoidal skin tags: Secondary | ICD-10-CM | POA: Insufficient documentation

## 2018-04-08 DIAGNOSIS — K5730 Diverticulosis of large intestine without perforation or abscess without bleeding: Secondary | ICD-10-CM

## 2018-04-08 DIAGNOSIS — Z800 Family history of malignant neoplasm of digestive organs: Secondary | ICD-10-CM | POA: Diagnosis not present

## 2018-04-08 DIAGNOSIS — M797 Fibromyalgia: Secondary | ICD-10-CM | POA: Insufficient documentation

## 2018-04-08 DIAGNOSIS — Z79899 Other long term (current) drug therapy: Secondary | ICD-10-CM | POA: Insufficient documentation

## 2018-04-08 DIAGNOSIS — Z888 Allergy status to other drugs, medicaments and biological substances status: Secondary | ICD-10-CM | POA: Insufficient documentation

## 2018-04-08 DIAGNOSIS — Z8249 Family history of ischemic heart disease and other diseases of the circulatory system: Secondary | ICD-10-CM | POA: Diagnosis not present

## 2018-04-08 HISTORY — PX: COLONOSCOPY WITH PROPOFOL: SHX5780

## 2018-04-08 SURGERY — COLONOSCOPY WITH PROPOFOL
Anesthesia: Monitor Anesthesia Care

## 2018-04-08 MED ORDER — CHLORHEXIDINE GLUCONATE CLOTH 2 % EX PADS: 6.0000 | MEDICATED_PAD | Freq: Once | CUTANEOUS | Status: DC

## 2018-04-08 MED ORDER — MIDAZOLAM HCL 5 MG/5ML IJ SOLN
INTRAMUSCULAR | Status: DC | PRN
  Administered 2018-04-08: 2 mg via INTRAVENOUS

## 2018-04-08 MED ORDER — MIDAZOLAM HCL 2 MG/2ML IJ SOLN
INTRAMUSCULAR | Status: AC
  Filled 2018-04-08: qty 2

## 2018-04-08 MED ORDER — PROPOFOL 500 MG/50ML IV EMUL
INTRAVENOUS | Status: DC | PRN
  Administered 2018-04-08: 125 ug/kg/min via INTRAVENOUS

## 2018-04-08 MED ORDER — ONDANSETRON HCL 4 MG/2ML IJ SOLN: 4.0000 mg | Freq: Once | INTRAMUSCULAR | Status: DC | PRN

## 2018-04-08 MED ORDER — LACTATED RINGERS IV SOLN
INTRAVENOUS | Status: DC
  Administered 2018-04-08: 13:00:00 via INTRAVENOUS

## 2018-04-08 MED ORDER — PROPOFOL 500 MG/50ML IV EMUL: INTRAVENOUS | Status: DC | PRN

## 2018-04-08 MED ORDER — PROPOFOL 10 MG/ML IV BOLUS
INTRAVENOUS | Status: AC
  Filled 2018-04-08: qty 40

## 2018-04-08 NOTE — Discharge Instructions (Signed)
Resume usual medications as before. High-fiber diet. No driving for 24 hours.   Next screening exam in 5 years.      Colonoscopy, Adult, Care After This sheet gives you information about how to care for yourself after your procedure. Your doctor may also give you more specific instructions. If you have problems or questions, call your doctor. Follow these instructions at home: General instructions   For the first 24 hours after the procedure: ? Do not drive or use machinery. ? Do not sign important documents. ? Do not drink alcohol. ? Do your daily activities more slowly than normal. ? Eat foods that are soft and easy to digest. ? Rest often.  Take over-the-counter or prescription medicines only as told by your doctor.  It is up to you to get the results of your procedure. Ask your doctor, or the department performing the procedure, when your results will be ready. To help cramping and bloating:  Try walking around.  Put heat on your belly (abdomen) as told by your doctor. Use a heat source that your doctor recommends, such as a moist heat pack or a heating pad. ? Put a towel between your skin and the heat source. ? Leave the heat on for 20-30 minutes. ? Remove the heat if your skin turns bright red. This is especially important if you cannot feel pain, heat, or cold. You can get burned. Eating and drinking  Drink enough fluid to keep your pee (urine) clear or pale yellow.  Return to your normal diet as told by your doctor. Avoid heavy or fried foods that are hard to digest.  Avoid drinking alcohol for as long as told by your doctor. Contact a doctor if:  You have blood in your poop (stool) 2-3 days after the procedure. Get help right away if:  You have more than a small amount of blood in your poop.  You see large clumps of tissue (blood clots) in your poop.  Your belly is swollen.  You feel sick to your stomach (nauseous).  You throw up (vomit).  You have a  fever.  You have belly pain that gets worse, and medicine does not help your pain. This information is not intended to replace advice given to you by your health care provider. Make sure you discuss any questions you have with your health care provider. Document Released: 10/03/2010 Document Revised: 05/25/2016 Document Reviewed: 05/25/2016 Elsevier Interactive Patient Education  2017 [employer]      Diverticulosis Diverticulosis is a condition that develops when small pouches (diverticula) form in the wall of the large intestine (colon). The colon is where water is absorbed and stool is formed. The pouches form when the inside layer of the colon pushes through weak spots in the outer layers of the colon. You may have a few pouches or many of them. What are the causes? The cause of this condition is not known. What increases the risk? The following factors may make you more likely to develop this condition:  Being older than age 68. Your risk for this condition increases with age. Diverticulosis is rare among people younger than age 68. By age 68, many people have it.  Eating a low-fiber diet.  Having frequent constipation.  Being overweight.  Not getting enough exercise.  Smoking.  Taking over-the-counter pain medicines, like aspirin and ibuprofen.  Having a family history of diverticulosis.  What are the signs or symptoms? In most people, there are no symptoms of this condition.  If you do have symptoms, they may include:  Bloating.  Cramps in the abdomen.  Constipation or diarrhea.  Pain in the lower left side of the abdomen.  How is this diagnosed? This condition is most often diagnosed during an exam for other colon problems. Because diverticulosis usually has no symptoms, it often cannot be diagnosed independently. This condition may be diagnosed by:  Using a flexible scope to examine the colon (colonoscopy).  Taking an X-ray of the colon after dye has been  put into the colon (barium enema).  Doing a CT scan.  How is this treated? You may not need treatment for this condition if you have never developed an infection related to diverticulosis. If you have had an infection before, treatment may include:  Eating a high-fiber diet. This may include eating more fruits, vegetables, and grains.  Taking a fiber supplement.  Taking a live bacteria supplement (probiotic).  Taking medicine to relax your colon.  Taking antibiotic medicines.  Follow these instructions at home:  Drink 6-8 glasses of water or more each day to prevent constipation.  Try not to strain when you have a bowel movement.  If you have had an infection before: ? Eat more fiber as directed by your health care provider or your diet and nutrition specialist (dietitian). ? Take a fiber supplement or probiotic, if your health care provider approves.  Take over-the-counter and prescription medicines only as told by your health care provider.  If you were prescribed an antibiotic, take it as told by your health care provider. Do not stop taking the antibiotic even if you start to feel better.  Keep all follow-up visits as told by your health care provider. This is important. Contact a health care provider if:  You have pain in your abdomen.  You have bloating.  You have cramps.  You have not had a bowel movement in 3 days. Get help right away if:  Your pain gets worse.  Your bloating becomes very bad.  You have a fever or chills, and your symptoms suddenly get worse.  You vomit.  You have bowel movements that are bloody or black.  You have bleeding from your rectum. Summary  Diverticulosis is a condition that develops when small pouches (diverticula) form in the wall of the large intestine (colon).  You may have a few pouches or many of them.  This condition is most often diagnosed during an exam for other colon problems.  If you have had an infection  related to diverticulosis, treatment may include increasing the fiber in your diet, taking supplements, or taking medicines. This information is not intended to replace advice given to you by your health care provider. Make sure you discuss any questions you have with your health care provider. Document Released: 05/28/2004 Document Revised: 07/20/2016 Document Reviewed: 07/20/2016 Elsevier Interactive Patient Education  2017 [employer] Inc.     Hemorrhoids Hemorrhoids are swollen veins in and around the rectum or anus. There are two types of hemorrhoids:  Internal hemorrhoids. These occur in the veins that are just inside the rectum. They may poke through to the outside and become irritated and painful.  External hemorrhoids. These occur in the veins that are outside of the anus and can be felt as a painful swelling or hard lump near the anus.  Most hemorrhoids do not cause serious problems, and they can be managed with home treatments such as diet and lifestyle changes. If home treatments do not help your symptoms, procedures  can be done to shrink or remove the hemorrhoids. What are the causes? This condition is caused by increased pressure in the anal area. This pressure may result from various things, including:  Constipation.  Straining to have a bowel movement.  Diarrhea.  Pregnancy.  Obesity.  Sitting for long periods of time.  Heavy lifting or other activity that causes you to strain.  Anal sex.  What are the signs or symptoms? Symptoms of this condition include:  Pain.  Anal itching or irritation.  Rectal bleeding.  Leakage of stool (feces).  Anal swelling.  One or more lumps around the anus.  How is this diagnosed? This condition can often be diagnosed through a visual exam. Other exams or tests may also be done, such as:  Examination of the rectal area with a gloved hand (digital rectal exam).  Examination of the anal canal using a small tube  (anoscope).  A blood test, if you have lost a significant amount of blood.  A test to look inside the colon (sigmoidoscopy or colonoscopy).  How is this treated? This condition can usually be treated at home. However, various procedures may be done if dietary changes, lifestyle changes, and other home treatments do not help your symptoms. These procedures can help make the hemorrhoids smaller or remove them completely. Some of these procedures involve surgery, and others do not. Common procedures include:  Rubber band ligation. Rubber bands are placed at the base of the hemorrhoids to cut off the blood supply to them.  Sclerotherapy. Medicine is injected into the hemorrhoids to shrink them.  Infrared coagulation. A type of light energy is used to get rid of the hemorrhoids.  Hemorrhoidectomy surgery. The hemorrhoids are surgically removed, and the veins that supply them are tied off.  Stapled hemorrhoidopexy surgery. A circular stapling device is used to remove the hemorrhoids and use staples to cut off the blood supply to them.  Follow these instructions at home: Eating and drinking  Eat foods that have a lot of fiber in them, such as whole grains, beans, nuts, fruits, and vegetables. Ask your health care provider about taking products that have added fiber (fiber supplements).  Drink enough fluid to keep your urine clear or pale yellow. Managing pain and swelling  Take warm sitz baths for 20 minutes, 3-4 times a day to ease pain and discomfort.  If directed, apply ice to the affected area. Using ice packs between sitz baths may be helpful. ? Put ice in a plastic bag. ? Place a towel between your skin and the bag. ? Leave the ice on for 20 minutes, 2-3 times a day. General instructions  Take over-the-counter and prescription medicines only as told by your health care provider.  Use medicated creams or suppositories as told.  Exercise regularly.  Go to the bathroom when you  have the urge to have a bowel movement. Do not wait.  Avoid straining to have bowel movements.  Keep the anal area dry and clean. Use wet toilet paper or moist towelettes after a bowel movement.  Do not sit on the toilet for long periods of time. This increases blood pooling and pain. Contact a health care provider if:  You have increasing pain and swelling that are not controlled by treatment or medicine.  You have uncontrolled bleeding.  You have difficulty having a bowel movement, or you are unable to have a bowel movement.  You have pain or inflammation outside the area of the hemorrhoids. This information is  not intended to replace advice given to you by your health care provider. Make sure you discuss any questions you have with your health care provider. Document Released: 08/28/2000 Document Revised: 01/29/2016 Document Reviewed: 05/15/2015 Elsevier Interactive Patient Education  2018 Elsevier Inc.     Monitored Anesthesia Care, Care After These instructions provide you with information about caring for yourself after your procedure. Your health care provider may also give you more specific instructions. Your treatment has been planned according to current medical practices, but problems sometimes occur. Call your health care provider if you have any problems or questions after your procedure. What can I expect after the procedure? After your procedure, it is common to:  Feel sleepy for several hours.  Feel clumsy and have poor balance for several hours.  Feel forgetful about what happened after the procedure.  Have poor judgment for several hours.  Feel nauseous or vomit.  Have a sore throat if you had a breathing tube during the procedure.  Follow these instructions at home: For at least 24 hours after the procedure:   Do not: ? Participate in activities in which you could fall or become injured. ? Drive. ? Use heavy machinery. ? Drink alcohol. ? Take  sleeping pills or medicines that cause drowsiness. ? Make important decisions or sign legal documents. ? Take care of children on your own.  Rest. Eating and drinking  Follow the diet that is recommended by your health care provider.  If you vomit, drink water, juice, or soup when you can drink without vomiting.  Make sure you have little or no nausea before eating solid foods. General instructions  Have a responsible adult stay with you until you are awake and alert.  Take over-the-counter and prescription medicines only as told by your health care provider.  If you smoke, do not smoke without supervision.  Keep all follow-up visits as told by your health care provider. This is important. Contact a health care provider if:  You keep feeling nauseous or you keep vomiting.  You feel light-headed.  You develop a rash.  You have a fever. Get help right away if:  You have trouble breathing. This information is not intended to replace advice given to you by your health care provider. Make sure you discuss any questions you have with your health care provider. Document Released: 12/22/2015 Document Revised: 04/22/2016 Document Reviewed: 12/22/2015 Elsevier Interactive Patient Education  [employer].

## 2018-04-08 NOTE — Anesthesia Preprocedure Evaluation (Signed)
Anesthesia Evaluation  Patient identified by MRN, date of birth, ID band Patient awake    Reviewed: Allergy & Precautions, H&P , NPO status , Patient's Chart, lab work & pertinent test results, reviewed documented beta blocker date and time   Airway Mallampati: II  TM Distance: >3 FB Neck ROM: full    Dental no notable dental hx.    Pulmonary neg pulmonary ROS,    Pulmonary exam normal breath sounds clear to auscultation       Cardiovascular Exercise Tolerance: Good negative cardio ROS   Rhythm:regular Rate:Normal     Neuro/Psych  Neuromuscular disease negative psych ROS   GI/Hepatic Neg liver ROS, GERD  ,  Endo/Other  Hypothyroidism   Renal/GU negative Renal ROS  negative genitourinary   Musculoskeletal   Abdominal   Peds  Hematology negative hematology ROS (+)   Anesthesia Other Findings   Reproductive/Obstetrics negative OB ROS                             Anesthesia Physical Anesthesia Plan  ASA: II  Anesthesia Plan: MAC   Post-op Pain Management:    Induction:   PONV Risk Score and Plan: Propofol infusion and TIVA  Airway Management Planned:   Additional Equipment:   Intra-op Plan:   Post-operative Plan:   Informed Consent: I have reviewed the patients History and Physical, chart, labs and discussed the procedure including the risks, benefits and alternatives for the proposed anesthesia with the patient or authorized representative who has indicated his/her understanding and acceptance.   Dental Advisory Given  Plan Discussed with: CRNA  Anesthesia Plan Comments:         Anesthesia Quick Evaluation

## 2018-04-08 NOTE — Transfer of Care (Signed)
Immediate Anesthesia Transfer of Care Note  Patient: Natalie Morrow  Procedure(s) Performed: COLONOSCOPY WITH PROPOFOL (N/A )  Patient Location: PACU  Anesthesia Type:MAC  Level of Consciousness: awake and patient cooperative  Airway & Oxygen Therapy: Patient Spontanous Breathing  Post-op Assessment: Report given to RN, Post -op Vital signs reviewed and stable and Patient moving all extremities  Post vital signs: Reviewed and stable  Last Vitals:  Vitals Value Taken Time  BP    Temp    Pulse    Resp    SpO2      Last Pain:  Vitals:   04/08/18 1202  TempSrc: Oral  PainSc: 0-No pain      Patients Stated Pain Goal: 7 (12/75/17 0017)  Complications: No apparent anesthesia complications

## 2018-04-08 NOTE — H&P (Signed)
Natalie Morrow is an 67 y.o. female.   Chief Complaint: Patient is here for colonoscopy. HPI: Patient is 67 year old Caucasian female who is here for higher screening colonoscopy.  She denies abdominal pain change in bowel habits or rectal bleeding. Last exam was normal at 5 years ago. Family history significant for CRC in her brother who was around 63 at the time of diagnosis and doing fine in his 4s. Family history significant for other cancers including throat cancer in her father and his sister.  Past Medical History:  Diagnosis Date  . AK (actinic keratosis) 09/17/2015  . Arthritis   . Bulging lumbar disc   . Bursitis of hip   . Fibromyalgia   . GERD (gastroesophageal reflux disease)   . Hormone replacement therapy (HRT) 09/05/2013  . Hypercholesteremia   . Hypothyroidism   . Plantar fasciitis, bilateral     Past Surgical History:  Procedure Laterality Date  . BACK SURGERY  05/2015  . CHOLECYSTECTOMY    . COLONOSCOPY N/A 11/10/2012   Procedure: COLONOSCOPY;  Surgeon: Malissa Hippo, MD;  Location: [facility];  Service: Endoscopy;  Laterality: N/A;  730  . TONSILLECTOMY      Family History  Problem Relation Age of Onset  . Dementia Mother   . Cancer Father        throat  . Cancer Sister        throat   . Cancer Brother        lung  . Cancer Paternal Uncle   . Cancer Paternal Uncle   . Cancer Paternal Uncle   . Cancer Paternal Uncle   . Heart attack Sister   . Diabetes Brother   . Cancer Brother        colon  . Diabetes Brother   . Coronary artery disease Brother   . Coronary artery disease Brother    Social History:  reports that she has never smoked. She has never used smokeless tobacco. She reports that she does not drink alcohol or use drugs.  Allergies:  Allergies  Allergen Reactions  . Crestor [Rosuvastatin] Itching    All statins  . Sulfa Antibiotics Swelling    Medications Prior to Admission  Medication Sig Dispense Refill  . acetaminophen  (TYLENOL) 500 MG tablet Take 500 mg by mouth as needed for mild pain.     . clonazePAM (KLONOPIN) 0.5 MG tablet Take 0.5 mg by mouth at bedtime.     Marland Kitchen doxycycline (MONODOX) 100 MG capsule Take 100 mg by mouth daily.  2  . levothyroxine (SYNTHROID, LEVOTHROID) 50 MCG tablet Take 50 mcg by mouth daily.    . metroNIDAZOLE (METROCREAM) 0.75 % cream Apply 1 application topically 2 (two) times daily. To the face as directed  1  . ranitidine (ZANTAC) 150 MG tablet Take 150 mg by mouth daily.     Marland Kitchen loratadine (CLARITIN) 10 MG tablet Take 10 mg by mouth daily as needed for allergies.    . Probiotic Product (ALIGN) 4 MG CAPS Take 4 mg by mouth 2 (two) times a week.      No results found for this or any previous visit (from the past 48 hour(s)). No results found.  ROS  Blood pressure 129/69, pulse 96, temperature 98.3 F (36.8 C), temperature source Oral, resp. rate (!) 23, height 5\' 6"  (1.676 m), weight 143 lb (64.9 kg), SpO2 96 %. Physical Exam  Constitutional: She appears well-developed and well-nourished.  HENT:  Mouth/Throat: Oropharynx is clear and  moist.  Eyes: Conjunctivae are normal. No scleral icterus.  Neck: No thyromegaly present.  Cardiovascular: Normal rate, regular rhythm and normal heart sounds.  No murmur heard. Respiratory: Effort normal and breath sounds normal.  GI: Soft. She exhibits no distension and no mass. There is no tenderness.  Musculoskeletal: She exhibits no edema.  Lymphadenopathy:    She has no cervical adenopathy.  Neurological: She is alert.  Skin: Skin is warm and dry.     Assessment/Plan High risk screening colonoscopy.  Lionel DecemberNajeeb Natalie Dalpe, MD 04/08/2018, 1:23 PM

## 2018-04-08 NOTE — Op Note (Signed)
[facility] Patient Name: Natalie RuizFrances Morrow Procedure Date: 04/08/2018 1:07 PM MRN: [id number] Date of Birth: [birth year] Attending MD: Lionel DecemberNajeeb Rehman , MD CSN: [id number] Age: [age] Admit Type: Outpatient Procedure:                Colonoscopy Indications:              Screening in patient at increased risk: Colorectal                            cancer in Natalie Morrow before age 67 Providers:                Lionel DecemberNajeeb Rehman, MD, Nena PolioLisa Moore, RN, Burke Keelsrisann Tilley,                            Technician Referring MD:             Estanislado PandyPaul W. Sasser, MD Medicines:                Propofol per Anesthesia Complications:            No immediate complications. Estimated Blood Loss:     Estimated blood loss: none. Procedure:                Pre-Anesthesia Assessment:                           - Prior to the procedure, a History and Physical                            was performed, and patient medications and                            allergies were reviewed. The patient's tolerance of                            previous anesthesia was also reviewed. The risks                            and benefits of the procedure and the sedation                            options and risks were discussed with the patient.                            All questions were answered, and informed consent                            was obtained. Prior Anticoagulants: The patient has                            taken no previous anticoagulant or antiplatelet                            agents. ASA Grade Assessment: II - A patient with  mild systemic disease. After reviewing the risks                            and benefits, the patient was deemed in                            satisfactory condition to undergo the procedure.                           After obtaining informed consent, the colonoscope                            was passed under direct vision. Throughout the                            procedure,  the patient's blood pressure, pulse, and                            oxygen saturations were monitored continuously. The                            [id number]-[id number] ([id number]) scope was introduced through                            the and advanced to the the cecum, identified by                            appendiceal orifice and ileocecal valve. The                            colonoscopy was performed without difficulty. The                            patient tolerated the procedure well. The quality                            of the bowel preparation was good. The ileocecal                            valve, appendiceal orifice, and rectum were                            photographed. Scope In: 1:34:35 PM Scope Out: 1:48:45 PM Scope Withdrawal Time: 0 hours 7 minutes 56 seconds  Total Procedure Duration: 0 hours 14 minutes 10 seconds  Findings:      The perianal and digital rectal examinations were normal.      A few medium-mouthed diverticula were found in the sigmoid colon.      The exam was otherwise normal throughout the examined colon.      External hemorrhoids were found during retroflexion. The hemorrhoids       were small. Impression:               - Diverticulosis in the sigmoid colon.                           -  External hemorrhoids.                           - No specimens collected. Moderate Sedation:      Per Anesthesia Care Recommendation:           - Patient has a contact number available for                            emergencies. The signs and symptoms of potential                            delayed complications were discussed with the                            patient. Return to normal activities tomorrow.                            Written discharge instructions were provided to the                            patient.                           - High fiber diet today.                           - Continue present medications.                           - Repeat  colonoscopy in 5 years for screening                            purposes. Procedure Code(s):        --- Professional ---                           [phone], Colonoscopy, flexible; diagnostic, including                            collection of specimen(s) by brushing or washing,                            when performed (separate procedure) Diagnosis Code(s):        --- Professional ---                           Z80.0, Family history of malignant neoplasm of                            digestive organs                           K64.4, Residual hemorrhoidal skin tags                           K57.30, Diverticulosis of large intestine without  perforation or abscess without bleeding CPT copyright 2017 American Medical Association. All rights reserved. The codes documented in this report are preliminary and upon coder review may  be revised to meet current compliance requirements. Lionel December, MD Lionel December, MD 04/08/2018 1:55:27 PM This report has been signed electronically. Number of Addenda: 0

## 2018-04-08 NOTE — Anesthesia Postprocedure Evaluation (Signed)
Anesthesia Post Note  Patient: Natalie Morrow  Procedure(s) Performed: COLONOSCOPY WITH PROPOFOL (N/A )  Patient location during evaluation: PACU Anesthesia Type: MAC Level of consciousness: awake and patient cooperative Pain management: pain level controlled Vital Signs Assessment: post-procedure vital signs reviewed and stable Respiratory status: spontaneous breathing, nonlabored ventilation and respiratory function stable Cardiovascular status: blood pressure returned to baseline Postop Assessment: no apparent nausea or vomiting Anesthetic complications: no     Last Vitals:  Vitals:   04/08/18 1304 04/08/18 1315  BP: 124/66 129/69  Pulse:    Resp: (!) 21 (!) 23  Temp:    SpO2: 99% 96%    Last Pain:  Vitals:   04/08/18 1202  TempSrc: Oral  PainSc: 0-No pain                 Sherril Heyward J

## 2018-04-13 ENCOUNTER — Encounter (HOSPITAL_COMMUNITY): Payer: Self-pay | Admitting: Internal Medicine

## 2018-10-13 ENCOUNTER — Other Ambulatory Visit: Payer: Commercial Managed Care - PPO | Admitting: Adult Health

## 2018-10-18 ENCOUNTER — Encounter (INDEPENDENT_AMBULATORY_CARE_PROVIDER_SITE_OTHER): Payer: Self-pay

## 2018-10-18 ENCOUNTER — Encounter: Payer: Self-pay | Admitting: Adult Health

## 2018-10-18 ENCOUNTER — Ambulatory Visit (INDEPENDENT_AMBULATORY_CARE_PROVIDER_SITE_OTHER): Payer: Commercial Managed Care - PPO | Admitting: Adult Health

## 2018-10-18 ENCOUNTER — Other Ambulatory Visit (HOSPITAL_COMMUNITY)
Admission: RE | Admit: 2018-10-18 | Discharge: 2018-10-18 | Disposition: A | Discharge status: Home / Self Care | Payer: Commercial Managed Care - PPO | Source: Ambulatory Visit | Attending: Adult Health | Admitting: Adult Health

## 2018-10-18 VITALS — BP 124/73 | HR 99 | Ht 65.25 in | Wt 144.0 lb

## 2018-10-18 DIAGNOSIS — Z01419 Encounter for gynecological examination (general) (routine) without abnormal findings: Secondary | ICD-10-CM

## 2018-10-18 DIAGNOSIS — Z1211 Encounter for screening for malignant neoplasm of colon: Secondary | ICD-10-CM | POA: Diagnosis not present

## 2018-10-18 DIAGNOSIS — Z1212 Encounter for screening for malignant neoplasm of rectum: Secondary | ICD-10-CM | POA: Diagnosis not present

## 2018-10-18 LAB — HEMOCCULT GUIAC POC 1CARD (OFFICE): FECAL OCCULT BLD: NEGATIVE

## 2018-10-18 NOTE — Progress Notes (Signed)
Patient ID: Natalie Morrow, female   DOB: 15-Mar-1951, 68 y.o.   MRN: [id number] History of Present Illness: Natalie Morrow is a 68 year old white female, married in for a well woman gyn exam and pap. PCP is Dr Neita Carp.   Current Medications, Allergies, Past Medical History, Past Surgical History, Family History and Social History were reviewed in [name].     Review of Systems:  Patient denies any headaches, hearing loss, fatigue, blurred vision, shortness of breath, chest pain, abdominal pain, problems with bowel movements, urination, or intercourse. No joint pain or mood swings.+vaginal dryness, +hot flashes at times and occasional night sweats.    Physical Exam:BP 124/73 (BP Location: Left Arm, Patient Position: Sitting, Cuff Size: Normal)   Pulse 99   Ht 5' 5.25" (1.657 m)   Wt 144 lb (65.3 kg)   BMI 23.78 kg/m  General:  Well developed, well nourished, no acute distress Skin:  Warm and dry Neck:  Midline trachea, normal thyroid, good ROM, no lymphadenopathy,no carotid bruits heard Lungs; Clear to auscultation bilaterally Breast:  No dominant palpable mass, retraction, or nipple discharge Cardiovascular: Regular rate and rhythm Abdomen:  Soft, non tender, no hepatosplenomegaly Pelvic:  External genitalia is normal in appearance, no lesions.  The vagina is pale and dry with loss of rugae. Urethra has no lesions or masses. The cervix is smooth,and atrophic, pap with HPV performed.Marland Kitchen  Uterus is felt to be normal size, shape, and contour.  No adnexal masses or tenderness noted.Bladder is non tender, no masses felt. Rectal: Good sphincter tone, no polyps, or hemorrhoids felt.  Hemoccult negative. Extremities/musculoskeletal:  No swelling or varicosities noted, no clubbing or cyanosis Psych:  No mood changes, alert and cooperative,seems happy Fall risk is low PHQ 2 score 0 Examination chaperoned by Marchelle Folks Rash LPN. Discussed rosacea and hot flashes. Try to  eat more fruits and veggies and fish, chicken and turkey(mediterian diet) and less processed foods,to decrease inflammation.  Impression:  1. Encounter for gynecological examination with Papanicolaou smear of cervix   2. Screening for colorectal cancer      Plan: Physical in 1 year Pap in 3 if normal Labs with PCP Mammogram yearly  Colonoscopy per GI Avoid sun, spicy food and alcohol and dress in layers

## 2018-10-19 LAB — CYTOLOGY - PAP
Diagnosis: NEGATIVE
HPV: NOT DETECTED

## 2019-06-07 DIAGNOSIS — Z23 Encounter for immunization: Secondary | ICD-10-CM | POA: Diagnosis not present

## 2019-06-19 DIAGNOSIS — M797 Fibromyalgia: Secondary | ICD-10-CM | POA: Diagnosis not present

## 2019-06-19 DIAGNOSIS — E782 Mixed hyperlipidemia: Secondary | ICD-10-CM | POA: Diagnosis not present

## 2019-06-19 DIAGNOSIS — E038 Other specified hypothyroidism: Secondary | ICD-10-CM | POA: Diagnosis not present

## 2019-06-23 DIAGNOSIS — M791 Myalgia, unspecified site: Secondary | ICD-10-CM | POA: Diagnosis not present

## 2019-06-23 DIAGNOSIS — Z6823 Body mass index (BMI) 23.0-23.9, adult: Secondary | ICD-10-CM | POA: Diagnosis not present

## 2019-06-23 DIAGNOSIS — T466X5A Adverse effect of antihyperlipidemic and antiarteriosclerotic drugs, initial encounter: Secondary | ICD-10-CM | POA: Diagnosis not present

## 2019-06-23 DIAGNOSIS — E782 Mixed hyperlipidemia: Secondary | ICD-10-CM | POA: Diagnosis not present

## 2019-06-23 DIAGNOSIS — M797 Fibromyalgia: Secondary | ICD-10-CM | POA: Diagnosis not present

## 2019-06-23 DIAGNOSIS — E038 Other specified hypothyroidism: Secondary | ICD-10-CM | POA: Diagnosis not present

## 2019-06-23 DIAGNOSIS — N189 Chronic kidney disease, unspecified: Secondary | ICD-10-CM | POA: Diagnosis not present

## 2019-06-23 DIAGNOSIS — M722 Plantar fascial fibromatosis: Secondary | ICD-10-CM | POA: Diagnosis not present

## 2019-09-07 DIAGNOSIS — N3000 Acute cystitis without hematuria: Secondary | ICD-10-CM | POA: Diagnosis not present

## 2019-09-07 DIAGNOSIS — Z6824 Body mass index (BMI) 24.0-24.9, adult: Secondary | ICD-10-CM | POA: Diagnosis not present

## 2019-11-08 ENCOUNTER — Other Ambulatory Visit: Payer: Commercial Managed Care - PPO | Admitting: Adult Health

## 2019-12-06 ENCOUNTER — Telehealth: Payer: Self-pay | Admitting: Adult Health

## 2019-12-06 NOTE — Telephone Encounter (Signed)

## 2019-12-07 ENCOUNTER — Other Ambulatory Visit: Payer: Self-pay

## 2019-12-07 ENCOUNTER — Ambulatory Visit (INDEPENDENT_AMBULATORY_CARE_PROVIDER_SITE_OTHER): Payer: PPO | Admitting: Adult Health

## 2019-12-07 ENCOUNTER — Encounter: Payer: Self-pay | Admitting: Adult Health

## 2019-12-07 VITALS — BP 120/68 | HR 102 | Ht 65.0 in | Wt 146.8 lb

## 2019-12-07 DIAGNOSIS — Z1212 Encounter for screening for malignant neoplasm of rectum: Secondary | ICD-10-CM

## 2019-12-07 DIAGNOSIS — Z01419 Encounter for gynecological examination (general) (routine) without abnormal findings: Secondary | ICD-10-CM | POA: Diagnosis not present

## 2019-12-07 DIAGNOSIS — Z1211 Encounter for screening for malignant neoplasm of colon: Secondary | ICD-10-CM | POA: Diagnosis not present

## 2019-12-07 LAB — HEMOCCULT GUIAC POC 1CARD (OFFICE): Fecal Occult Blood, POC: NEGATIVE

## 2019-12-07 NOTE — Progress Notes (Signed)
Patient ID: Natalie Morrow, female   DOB: Oct 20, 1950, 69 y.o.   MRN: [id number] History of Present Illness: Natalie Morrow is a 69 year old white female,married, PM, retired in July 2020, in for a well woman gyn exam,she had a normal pap with negative HPV 10/18/2018. PCP is Dr Neita Carp.   Current Medications, Allergies, Past Medical History, Past Surgical History, Family History and Social History were reviewed in [name].     Review of Systems:  Patient denies any headaches, hearing loss, fatigue, blurred vision, shortness of breath, chest pain, abdominal pain, problems with bowel movements, urination, or intercourse. No joint pain or mood swings. Has noticed not sleeping as well since retired, will try melatonin and if wakes go to recliner.  Physical Exam:BP 120/68 (BP Location: Left Arm, Patient Position: Sitting, Cuff Size: Normal)   Pulse (!) 102   Ht 5\' 5"  (1.651 m)   Wt 146 lb 12.8 oz (66.6 kg)   BMI 24.43 kg/m  General:  Well developed, well nourished, no acute distress Skin:  Warm and dry,has increased number AKs Neck:  Midline trachea, normal thyroid, good ROM, no lymphadenopathy,no carotid bruits heard  Lungs; Clear to auscultation bilaterally Breast:  No dominant palpable mass, retraction, or nipple discharge Cardiovascular: Regular rate and rhythm Abdomen:  Soft, non tender, no hepatosplenomegaly Pelvic:  External genitalia is normal in appearance, no lesions.  The vagina is pale with loss of moisture and rugae. Urethra has no lesions or masses. The cervix is smooth..  Uterus is felt to be normal size, shape, and contour.  No adnexal masses or tenderness noted.Bladder is non tender, no masses felt. Rectal: Good sphincter tone, no polyps, or hemorrhoids felt.  Hemoccult negative. Extremities/musculoskeletal:  No swelling or varicosities noted, no clubbing or cyanosis Psych:  No mood changes, alert and cooperative,seems happy Fall risk is low PHQ 2  score is 0 Alcohol audit 0  Examination chaperoned by Rash LPN.  Impression and Plan: 1. Encounter for well woman exam with routine gynecological exam Pap and physical in 2 years Mammogram yearly Labs with PCP Ask PCP when had DEXA last  2. Screening for colorectal cancer Colonoscopy per GI

## 2019-12-18 DIAGNOSIS — N183 Chronic kidney disease, stage 3 unspecified: Secondary | ICD-10-CM | POA: Diagnosis not present

## 2019-12-18 DIAGNOSIS — E782 Mixed hyperlipidemia: Secondary | ICD-10-CM | POA: Diagnosis not present

## 2019-12-18 DIAGNOSIS — M797 Fibromyalgia: Secondary | ICD-10-CM | POA: Diagnosis not present

## 2019-12-18 DIAGNOSIS — E038 Other specified hypothyroidism: Secondary | ICD-10-CM | POA: Diagnosis not present

## 2019-12-18 DIAGNOSIS — F411 Generalized anxiety disorder: Secondary | ICD-10-CM | POA: Diagnosis not present

## 2019-12-18 DIAGNOSIS — E559 Vitamin D deficiency, unspecified: Secondary | ICD-10-CM | POA: Diagnosis not present

## 2019-12-22 DIAGNOSIS — R42 Dizziness and giddiness: Secondary | ICD-10-CM | POA: Diagnosis not present

## 2019-12-22 DIAGNOSIS — Z0001 Encounter for general adult medical examination with abnormal findings: Secondary | ICD-10-CM | POA: Diagnosis not present

## 2019-12-22 DIAGNOSIS — E782 Mixed hyperlipidemia: Secondary | ICD-10-CM | POA: Diagnosis not present

## 2019-12-22 DIAGNOSIS — E039 Hypothyroidism, unspecified: Secondary | ICD-10-CM | POA: Diagnosis not present

## 2020-03-07 DIAGNOSIS — Z1231 Encounter for screening mammogram for malignant neoplasm of breast: Secondary | ICD-10-CM | POA: Diagnosis not present

## 2020-03-21 DIAGNOSIS — M25512 Pain in left shoulder: Secondary | ICD-10-CM | POA: Diagnosis not present

## 2020-04-02 DIAGNOSIS — M7550 Bursitis of unspecified shoulder: Secondary | ICD-10-CM | POA: Diagnosis not present

## 2020-04-30 DIAGNOSIS — H2511 Age-related nuclear cataract, right eye: Secondary | ICD-10-CM | POA: Diagnosis not present

## 2020-05-06 DIAGNOSIS — Z6822 Body mass index (BMI) 22.0-22.9, adult: Secondary | ICD-10-CM | POA: Diagnosis not present

## 2020-05-06 DIAGNOSIS — M722 Plantar fascial fibromatosis: Secondary | ICD-10-CM | POA: Diagnosis not present

## 2020-05-06 DIAGNOSIS — M7550 Bursitis of unspecified shoulder: Secondary | ICD-10-CM | POA: Diagnosis not present

## 2020-06-12 DIAGNOSIS — Z23 Encounter for immunization: Secondary | ICD-10-CM | POA: Diagnosis not present

## 2020-07-16 DIAGNOSIS — E039 Hypothyroidism, unspecified: Secondary | ICD-10-CM | POA: Diagnosis not present

## 2020-07-16 DIAGNOSIS — E782 Mixed hyperlipidemia: Secondary | ICD-10-CM | POA: Diagnosis not present

## 2020-07-16 DIAGNOSIS — N183 Chronic kidney disease, stage 3 unspecified: Secondary | ICD-10-CM | POA: Diagnosis not present

## 2020-07-22 DIAGNOSIS — Z6821 Body mass index (BMI) 21.0-21.9, adult: Secondary | ICD-10-CM | POA: Diagnosis not present

## 2020-07-22 DIAGNOSIS — E782 Mixed hyperlipidemia: Secondary | ICD-10-CM | POA: Diagnosis not present

## 2020-07-22 DIAGNOSIS — M791 Myalgia, unspecified site: Secondary | ICD-10-CM | POA: Diagnosis not present

## 2020-07-22 DIAGNOSIS — T466X5A Adverse effect of antihyperlipidemic and antiarteriosclerotic drugs, initial encounter: Secondary | ICD-10-CM | POA: Diagnosis not present

## 2020-07-22 DIAGNOSIS — Z1389 Encounter for screening for other disorder: Secondary | ICD-10-CM | POA: Diagnosis not present

## 2020-07-22 DIAGNOSIS — Z1331 Encounter for screening for depression: Secondary | ICD-10-CM | POA: Diagnosis not present

## 2020-07-22 DIAGNOSIS — R42 Dizziness and giddiness: Secondary | ICD-10-CM | POA: Diagnosis not present

## 2020-08-02 DIAGNOSIS — R42 Dizziness and giddiness: Secondary | ICD-10-CM | POA: Diagnosis not present

## 2020-08-02 DIAGNOSIS — R479 Unspecified speech disturbances: Secondary | ICD-10-CM | POA: Diagnosis not present

## 2020-08-05 DIAGNOSIS — E079 Disorder of thyroid, unspecified: Secondary | ICD-10-CM | POA: Diagnosis not present

## 2020-08-05 DIAGNOSIS — I252 Old myocardial infarction: Secondary | ICD-10-CM | POA: Diagnosis not present

## 2020-08-05 DIAGNOSIS — R9431 Abnormal electrocardiogram [ECG] [EKG]: Secondary | ICD-10-CM | POA: Diagnosis not present

## 2020-08-05 DIAGNOSIS — R410 Disorientation, unspecified: Secondary | ICD-10-CM | POA: Diagnosis not present

## 2020-08-05 DIAGNOSIS — N3000 Acute cystitis without hematuria: Secondary | ICD-10-CM | POA: Diagnosis not present

## 2020-08-05 DIAGNOSIS — R4182 Altered mental status, unspecified: Secondary | ICD-10-CM | POA: Diagnosis not present

## 2020-08-06 DIAGNOSIS — Z6821 Body mass index (BMI) 21.0-21.9, adult: Secondary | ICD-10-CM | POA: Diagnosis not present

## 2020-08-06 DIAGNOSIS — N3090 Cystitis, unspecified without hematuria: Secondary | ICD-10-CM | POA: Diagnosis not present

## 2020-08-06 DIAGNOSIS — E039 Hypothyroidism, unspecified: Secondary | ICD-10-CM | POA: Diagnosis not present

## 2020-08-06 DIAGNOSIS — E782 Mixed hyperlipidemia: Secondary | ICD-10-CM | POA: Diagnosis not present

## 2020-08-06 DIAGNOSIS — R410 Disorientation, unspecified: Secondary | ICD-10-CM | POA: Diagnosis not present

## 2020-08-06 DIAGNOSIS — R42 Dizziness and giddiness: Secondary | ICD-10-CM | POA: Diagnosis not present

## 2020-08-13 DIAGNOSIS — I6523 Occlusion and stenosis of bilateral carotid arteries: Secondary | ICD-10-CM | POA: Diagnosis not present

## 2020-08-13 DIAGNOSIS — R42 Dizziness and giddiness: Secondary | ICD-10-CM | POA: Diagnosis not present

## 2020-08-13 DIAGNOSIS — M791 Myalgia, unspecified site: Secondary | ICD-10-CM | POA: Diagnosis not present

## 2020-08-13 DIAGNOSIS — R1310 Dysphagia, unspecified: Secondary | ICD-10-CM | POA: Diagnosis not present

## 2020-08-15 DIAGNOSIS — Z6821 Body mass index (BMI) 21.0-21.9, adult: Secondary | ICD-10-CM | POA: Diagnosis not present

## 2020-08-15 DIAGNOSIS — R42 Dizziness and giddiness: Secondary | ICD-10-CM | POA: Diagnosis not present

## 2020-08-15 DIAGNOSIS — N390 Urinary tract infection, site not specified: Secondary | ICD-10-CM | POA: Diagnosis not present

## 2020-08-15 DIAGNOSIS — E782 Mixed hyperlipidemia: Secondary | ICD-10-CM | POA: Diagnosis not present

## 2020-08-15 DIAGNOSIS — R410 Disorientation, unspecified: Secondary | ICD-10-CM | POA: Diagnosis not present

## 2020-08-15 DIAGNOSIS — N3090 Cystitis, unspecified without hematuria: Secondary | ICD-10-CM | POA: Diagnosis not present

## 2020-08-15 DIAGNOSIS — E039 Hypothyroidism, unspecified: Secondary | ICD-10-CM | POA: Diagnosis not present

## 2020-09-02 DIAGNOSIS — E039 Hypothyroidism, unspecified: Secondary | ICD-10-CM | POA: Diagnosis not present

## 2020-09-02 DIAGNOSIS — E782 Mixed hyperlipidemia: Secondary | ICD-10-CM | POA: Diagnosis not present

## 2020-09-02 DIAGNOSIS — R42 Dizziness and giddiness: Secondary | ICD-10-CM | POA: Diagnosis not present

## 2020-09-02 DIAGNOSIS — N3090 Cystitis, unspecified without hematuria: Secondary | ICD-10-CM | POA: Diagnosis not present

## 2020-09-02 DIAGNOSIS — R410 Disorientation, unspecified: Secondary | ICD-10-CM | POA: Diagnosis not present

## 2020-09-02 DIAGNOSIS — Z6821 Body mass index (BMI) 21.0-21.9, adult: Secondary | ICD-10-CM | POA: Diagnosis not present

## 2020-09-19 ENCOUNTER — Encounter: Payer: Self-pay | Admitting: Neurology

## 2020-09-19 ENCOUNTER — Ambulatory Visit: Payer: PPO | Admitting: Neurology

## 2020-09-19 ENCOUNTER — Other Ambulatory Visit: Payer: Self-pay

## 2020-09-19 VITALS — BP 136/70 | HR 103 | Ht 65.0 in | Wt 128.8 lb

## 2020-09-19 DIAGNOSIS — R410 Disorientation, unspecified: Secondary | ICD-10-CM | POA: Diagnosis not present

## 2020-09-19 NOTE — Patient Instructions (Signed)
1.  Check EEG 2.  Will order neuropsychological evaluation 3.  Will get labs from Dr Dian Situ office 4.  Follow up after testing.

## 2020-09-19 NOTE — Progress Notes (Addendum)
NEUROLOGY CONSULTATION NOTE  Natalie Morrow MRN: [id number] DOB: 01-07-1951  Referring provider: Fara Chute, MD Primary care provider: Fara Chute, MD  Reason for consult:  delirium   Subjective:  Natalie Morrow is a 70 year old left-handed female with hypothyroidism who presents for acute delirium.  She is accompanied by her son who supplements history.  ED note reviewed.  Didn't know who he was and didn't want him to sleep in bed and go home.  -  Next morning still confused recognized son.  Always a different person. Antibiotic - still confused around father - now totally recovered -  Once in a while - slightly confused about son.  Also trouble sleeping - anxiety or arthritic pain - when sleep better, less confusion.    Prior to this, son calls every Sunday and for month prior not talking much.  Trouble finishing sentence  Trouble with dates.   Husband - ill kdney function down and emotional    On the evening of 08/04/2020, she did not recognize her husband.  She wasn't agitated but didn't want to sleep in the same bed and asked him to leave the house.  The next morning, she still didn't recognize her husband.  Her son came over and she did recognize her son.  They brought her to the ED.  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  CMP showed normal kidney and liver function with no electrolyte abnormalities.  TSH was 1.687.  WBC on CBC was normal.  UA was positive for UTI.  She was discharged on antibiotics.  B12, folate and RPR reportedly normal.  Over the next several weeks, symptoms slowly improved but have not completely resolved.  She will still sometimes briefly not recognize her husband.  On one occasion, she did not really recognize her son either.  She has still been able to perform all ADLs however she has not been driving since this started.  No headache, fever,  cough.  Prior to onset, she had been experiencing dizziness/vertigo.  Also, she appeared to not be as talkative on the phone.  She has been under increased stress caring for her husband who has been having worsening kidney disease and may need to start dialysis.   Mother and maternal aunt dementia.   PAST MEDICAL HISTORY: Past Medical History:  Diagnosis Date  . AK (actinic keratosis) 09/17/2015  . Arthritis   . Bulging lumbar disc   . Bursitis of hip   . Fibromyalgia   . GERD (gastroesophageal reflux disease)   . Hormone replacement therapy (HRT) 09/05/2013  . Hypercholesteremia   . Hypothyroidism   . Plantar fasciitis, bilateral   . Rosacea     PAST SURGICAL HISTORY: Past Surgical History:  Procedure Laterality Date  . BACK SURGERY  05/2015  . CHOLECYSTECTOMY    . COLONOSCOPY N/A 11/10/2012   Procedure: COLONOSCOPY;  Surgeon: Malissa Hippo, MD;  Location: [facility];  Service: Endoscopy;  Laterality: N/A;  730  . COLONOSCOPY WITH PROPOFOL N/A 04/08/2018   Procedure: COLONOSCOPY WITH PROPOFOL;  Surgeon: Malissa Hippo, MD;  Location: [facility];  Service: Endoscopy;  Laterality: N/A;  100  . TONSILLECTOMY      MEDICATIONS: Current Outpatient Medications on File Prior to Visit  Medication Sig Dispense Refill  . acetaminophen (TYLENOL) 500 MG tablet Take 500 mg by mouth as needed for mild pain.     Marland Kitchen  Famotidine (PEPCID AC PO) Take by mouth daily.    Marland Kitchen levothyroxine (SYNTHROID, LEVOTHROID) 50 MCG tablet Take 50 mcg by mouth daily.    Marland Kitchen loratadine (CLARITIN) 10 MG tablet Take 10 mg by mouth daily as needed for allergies.    . Probiotic Product (ALIGN) 4 MG CAPS Take 4 mg by mouth 2 (two) times a week.     No current facility-administered medications on file prior to visit.    ALLERGIES: Allergies  Allergen Reactions  . Crestor [Rosuvastatin] Itching    All statins  . Sulfa Antibiotics Swelling    FAMILY HISTORY: Family History  Problem Relation Age of Onset  .  Dementia Mother   . Cancer Father        throat  . Cancer Sister        throat   . Cancer Brother        lung  . Cancer Paternal Uncle   . Cancer Paternal Uncle   . Cancer Paternal Uncle   . Cancer Paternal Uncle   . Heart attack Sister   . Diabetes Brother   . Cancer Brother        colon  . Diabetes Brother   . Coronary artery disease Brother   . Coronary artery disease Brother     SOCIAL HISTORY: Social History   Socioeconomic History  . Marital status: Married    Spouse name: Not on file  . Number of children: Not on file  . Years of education: Not on file  . Highest education level: Not on file  Occupational History  . Not on file  Tobacco Use  . Smoking status: Never Smoker  . Smokeless tobacco: Never Used  Vaping Use  . Vaping Use: Never used  Substance and Sexual Activity  . Alcohol use: No  . Drug use: No  . Sexual activity: Yes    Birth control/protection: Post-menopausal  Other Topics Concern  . Not on file  Social History Narrative  . Not on file   Social Determinants of Health   Financial Resource Strain: Low Risk   . Difficulty of Paying Living Expenses: Not hard at all  Food Insecurity: No Food Insecurity  . Worried About Charity fundraiser in the Last Year: Never true  . Ran Out of Food in the Last Year: Never true  Transportation Needs: No Transportation Needs  . Lack of Transportation (Medical): No  . Lack of Transportation (Non-Medical): No  Physical Activity: Sufficiently Active  . Days of Exercise per Week: 7 days  . Minutes of Exercise per Session: 30 min  Stress: No Stress Concern Present  . Feeling of Stress : Not at all  Social Connections: Moderately Integrated  . Frequency of Communication with Friends and Family: More than three times a week  . Frequency of Social Gatherings with Friends and Family: Twice a week  . Attends Religious Services: More than 4 times per year  . Active Member of Clubs or Organizations: No  .  Attends Archivist Meetings: Never  . Marital Status: Married  Human resources officer Violence: Not At Risk  . Fear of Current or Ex-Partner: No  . Emotionally Abused: No  . Physically Abused: No  . Sexually Abused: No    Objective:  Blood pressure 136/70, pulse (!) 103, height 5\' 5"  (1.651 m), weight 128 lb 12.8 oz (58.4 kg), SpO2 94 %. General: No acute distress.  Patient appears well-groomed.   Head:  Normocephalic/atraumatic Eyes:  fundi examined  but not visualized Neck: supple, no paraspinal tenderness, full range of motion Back: No paraspinal tenderness Heart: regular rate and rhythm Lungs: Clear to auscultation bilaterally. Vascular: No carotid bruits. Neurological Exam: Mental status:  [employer] Mental Exam 09/19/2020  Weekday Correct 1  Current year 1  What state are we in? 1  Amount spent 1  Amount left 0  # of Animals 2  5 objects recall 0  Number series 1  Hour markers 2  Time correct 0  Placed X in triangle correctly 1  Largest Figure 1  Name of female 2  Date back to work 2  Type of work 2  State she lived in 0  Total score 17   Cranial nerves: CN I: not tested CN II: pupils equal, round and reactive to light, visual fields intact CN III, IV, VI:  full range of motion, no nystagmus, no ptosis CN V: facial sensation intact. CN VII: upper and lower face symmetric CN VIII: hearing intact CN IX, X: gag intact, uvula midline CN XI: sternocleidomastoid and trapezius muscles intact CN XII: tongue midline Bulk & Tone: normal, no fasciculations. Motor:  muscle strength 5/5 throughout Sensation:  Pinprick, temperature and vibratory sensation intact. Deep Tendon Reflexes:  2+ throughout,  toes downgoing.   Finger to nose testing:  Without dysmetria.   Heel to shin:  Without dysmetria.   Gait:  Normal station and stride.  Romberg negative.  Assessment/Plan:   1.  Delirium in setting of UTI, still with residual confusion.  Query underlying  cognitive impairment  1.  EEG 2.  Neuropsychological evaluation 3.  Follow up after testing.  Thank you for allowing me to take part in the care of this patient.  Shon Millet, DO  CC:  Fara Chute, MD

## 2020-09-25 ENCOUNTER — Other Ambulatory Visit: Payer: Self-pay

## 2020-09-25 ENCOUNTER — Ambulatory Visit (INDEPENDENT_AMBULATORY_CARE_PROVIDER_SITE_OTHER): Payer: PPO | Admitting: Neurology

## 2020-09-25 DIAGNOSIS — R410 Disorientation, unspecified: Secondary | ICD-10-CM

## 2020-09-26 NOTE — Procedures (Signed)
ELECTROENCEPHALOGRAM REPORT  Date of Study: 09/26/2019  Patient's Name: Natalie Morrow MRN: [id number] Date of Birth: 1951/08/23  Clinical History: 70 year old female with episodes of confusion - not recognizing her husband.  Medications: TYLENOL 500 MG tablet PEPCID AC PO SYNTHROID, LEVOTHROID 50 MCG tablet CLARITIN 10 MG tablet ALIGN 4 MG CAPS  Technical Summary: A multichannel digital EEG recording measured by the international 10-20 system with electrodes applied with paste and impedances below 5000 ohms performed in our laboratory with EKG monitoring in an awake and drowsy patient.  Hyperventilation not performed as patient is wearing a face mask due to the COVID-19 pandemic.  Photic stimulation was performed.  The digital EEG was referentially recorded, reformatted, and digitally filtered in a variety of bipolar and referential montages for optimal display.    Description: The patient is awake and drowsy during the recording.  During maximal wakefulness, there is a symmetric, medium voltage 8 Hz posterior dominant rhythm that attenuates with eye opening.  There is mild intermittent left temporal slowing.  During drowsiness, there is an increase in theta slowing of the background.  Stage 2 sleep was not seen.  Photic stimulation did not elicit any abnormalities.  There were no epileptiform discharges or electrographic seizures seen.    EKG lead was unremarkable.  Impression: This awake and drowsy EEG is abnormal due to mild left temporal slowing  Clinical Correlation: The above findings may suggest a focal physiologic abnormality in the left temporal region.  Clinical correlation is advised.   Shon Millet, DO

## 2020-09-27 NOTE — Progress Notes (Signed)
Pt advised of her EEG results. Husband wanted to know if there is any medication that the pt could take until she can see Dr> stewart. The pt still having some issues with memory.

## 2020-09-27 NOTE — Progress Notes (Signed)
Pt husband advised of DR.Jaffe note.

## 2020-10-28 ENCOUNTER — Ambulatory Visit (INDEPENDENT_AMBULATORY_CARE_PROVIDER_SITE_OTHER): Payer: PPO | Admitting: Counselor

## 2020-10-28 ENCOUNTER — Other Ambulatory Visit: Payer: Self-pay

## 2020-10-28 ENCOUNTER — Encounter: Payer: Self-pay | Admitting: Counselor

## 2020-10-28 ENCOUNTER — Ambulatory Visit: Payer: PPO

## 2020-10-28 DIAGNOSIS — F09 Unspecified mental disorder due to known physiological condition: Secondary | ICD-10-CM | POA: Diagnosis not present

## 2020-10-28 DIAGNOSIS — G3184 Mild cognitive impairment, so stated: Secondary | ICD-10-CM

## 2020-10-28 HISTORY — DX: Mild cognitive impairment of uncertain or unknown etiology: G31.84

## 2020-10-28 NOTE — Progress Notes (Signed)
     NEUROPSYCHOLOGICAL TEST SCORES [facility]  Patient Name: DAVY FAUGHT MRN: [id number] Date of Birth: 12-05-1950 Age: 70 y.o. Education: 12 years  Measurement properties of test scores: IQ, Index, and Standard Scores (SS): Mean = 100; Standard Deviation = 15 Scaled Scores (Ss): Mean = 10; Standard Deviation = 3 Z scores (Z): Mean = 0; Standard Deviation = 1 T scores (T); Mean = 50; Standard Deviation = 10  TEST SCORES:    Note: This summary of test scores accompanies the interpretive report and should not be interpreted by unqualified individuals or in isolation without reference to the report. Test scores are relative to age, gender, and educational history as available and appropriate.   Performance Validity         Total Score Descriptor  MMSE 22 Mild Dementia      Expected Functioning        Wide Range Achievement Test (Word Reading): Standard/Scaled Score Percentile       Word Reading 102 55      Cognitive Testing        RBANS, Form : Standard/Scaled Score Percentile  Total Score 74 4  Immediate Memory 57 <1      List Learning 3 1      Story Memory 3 1  Visuospatial/Constructional 100 50      Figure Copy   (19) 12 75      Judgment of Line Orientation   (15) --- 26-50  Language 85 16      Picture Naming --- 26-50      Semantic Fluency 4 2  Attention 85 16      Digit Span 8 25      Coding 7 16  Delayed Memory 68 2      List Recall   (3) --- 17-25      List Recognition   (18) --- 17-25      Story Recall   (4) 5 5      Figure Recall   (3) 3 1      Neuropsychological Assessment Battery (Language Module): T-score Percentile      Naming   (28) 42 21      Verbal Fluency: T-score Percentile      Controlled Oral Word Association (F-A-S) 22 <1      Semantic Fluency (Animals) 37 9      Trail Making Test: T-Score Percentile      Part A 41 18      Part B 36 8      Boston Diagnostic Aphasia Exam: Raw Score Scaled Score      Complex Ideational  Material 10 7      Clock Drawing Raw Score Descriptor      Command 4 Moderate Impairment      Rating Scales        Quick Dementia Rating System Raw Score Descriptor      Sum of Boxes 2.5 Very Mild Dementia      Total Score 4.5 MCI  Geriatric Depression Scale - Short Form 5 Negative   Kodee Ravert V. Roseanne Reno PsyD, ABN Clinical Neuropsychologist

## 2020-10-28 NOTE — Progress Notes (Signed)
   Psychometrist Note   Cognitive testing was administered to Natalie Morrow by Lamar Benes, B.S. (Technician) under the supervision of Alphonzo Severance, Psy.D., ABN. Natalie Morrow was able to tolerate all test procedures. Dr. Nicole Kindred met with the patient as needed to manage any emotional reactions to the testing procedures. Rest breaks were offered.    The battery of tests administered was selected by Dr. Nicole Kindred with consideration to the patient's current level of functioning, the nature of her symptoms, emotional and behavioral responses during the interview, level of literacy, observed level of motivation/effort, and the nature of the referral question. This battery was communicated to the psychometrist. Communication between Dr. Nicole Kindred and the psychometrist was ongoing throughout the evaluation and Dr. Nicole Kindred was immediately accessible at all times. Dr. Nicole Kindred provided supervision to the technician on the date of this service, to the extent necessary to assure the quality of all services provided.    Natalie Morrow will return in approximately one week for an interactive feedback session with Dr. Nicole Kindred, at which time test performance, clinical impressions, and treatment recommendations will be reviewed in detail. The patient understands she can contact our office should she require our assistance before this time.   A total of 85 minutes of billable time were spent with Natalie Morrow by the technician, including test administration and scoring time. Billing for these services is reflected in Dr. Les Pou note.   This note reflects time spent with the psychometrician and does not include test scores, clinical history, or any interpretations made by Dr. Nicole Kindred. The full report will follow in a separate note.

## 2020-10-28 NOTE — Progress Notes (Signed)
NEUROPSYCHOLOGICAL EVALUATION [facility]  Morrow Name: Natalie Morrow MRN: [id number] Date of Birth: 06-Dec-1950 Age: 70 y.o. Education: 12 years  Referral Circumstances and Background Information  Natalie Morrow is a 70 y.o., left-hand dominant, married woman with a history of suspected delirium. She [facility], had a normal CT head, and was found to have  UTI, which was treated buy Cephalexin. I see from review of Natalie notes that her said said she had been having memory difficulties over Natalie past several months, in addition to vertigo. She followed up with Dr. Everlena CooperJaffe more recently, with our outpatient neurology practice, and he noted that she remained confused and was concerned about Natalie possibility to an underlying disorder.   On interview, Natalie Morrow had a hard time giving much history. She commented that she feels anxious although she wasn't clear if she feels altered. Her husband thinks that this may be a stress reaction of some sort, he has stage IV kidney disease and he had an appointment with his doctor and may have to go on dialysis. Her difficulties were noticed after that and stated that she "blocked [me] out."  He gave very little information on history but also commented that it was hard to talk about this in front of Natalie Morrow as I walked him out when she started testing. He is largely denying any problems in memory and thinking before Natalie episode of delirium. She was having problems with vertigo, however, and she also had intermittent issues with vertigo for Natalie past 2 or 3 days. She has been having significant problems since Natalie episode of delirium, she will talk to her husband as though he is someone else. She will say "those are [name]'s things" to her husband, and he will need to remind her that he is [name] She also gets confused and thinks that she isn't at her house when they are at home. She has commented that there are many people at  their house and he will need to reassure her that they are Natalie only ones there. She has no hallucinations of people or other things, however, that her husband has noticed. He is denying much in Natalie way of memory loss, she does have problems with word finding. He is denying that she has much in Natalie way of difficulties with orientation to time. He thinks that she is having a hard time with judgment and problem solving. He isn't noticing much in Natalie way of improvement or worsening, she has a fluctuating course. They didn't clearly describe her mood, it sounds like she can get tearful over things but it is not persistent. They are denying any significant agitation or behavrioral concerns. She is not sleeping well, perhaps 5 or 6 hours a night, she doesn't sleep consistently. Her appetite is not as good as in Natalie past and she has lost 16lbs since this happened. Her energy is adequate.   With respect to functioning, she is still able to do basic household chores like clean, do Natalie laundry, wash dishes, and cook simple meals. She does not cook anything complicated. Her husband has also noticed that she does not read as much as she used to. She is not driving since she became symptomatic of her delirium. He said that she is still managing Natalie finances, which is surprising given their complaints. Natalie Morrow's husband said that he does check occasionally and she is still doing adequately. Her husband thinks that she  would be able to go to Natalie grocery store and get a list of items without difficulty, although that is dubious, because she was getting confused in Natalie clinic today simply on Natalie way back from Natalie restroom. She doesn't really do much else in Natalie community on her own. They denied any difficulties using her smart phone, appliances, or other electronics.   Past Medical History and Review of Relevant Studies   Morrow Active Problem List   Diagnosis Date Noted  . Screening for colorectal cancer 12/07/2019  .  Encounter for well woman exam with routine gynecological exam 12/07/2019  . Family hx of colon cancer 02/17/2018  . Rectocele 10/12/2017  . Well female exam with routine gynecological exam 10/12/2017  . Vaginal dryness 10/12/2017  . AK (actinic keratosis) 09/17/2015   Review of Neuroimaging and Relevant Medical History: Natalie Morrow has a CT head from 08/05/2020 that was read as normal.   Natalie Morrow had an EEG on 09/25/2020 that revealed mild left temporal slowing and no generalized slowing. During maximal wakefulness she had 8hz  posterior dominant rhythm that attenuated with eye opening.   Current Outpatient Medications  Medication Sig Dispense Refill  . acetaminophen (TYLENOL) 500 MG tablet Take 500 mg by mouth as needed for mild pain.     . Famotidine (PEPCID AC PO) Take by mouth daily.    levothyroxine (SYNTHROID, LEVOTHROID) 50 MCG tablet Take 50 mcg by mouth daily.    Marland Kitchen loratadine (CLARITIN) 10 MG tablet Take 10 mg by mouth daily as needed for allergies. (Morrow not taking: Reported on 09/19/2020)    . Probiotic Product (ALIGN) 4 MG CAPS Take 4 mg by mouth 2 (two) times a week. (Morrow not taking: Reported on 09/19/2020)     No current facility-administered medications for this visit.   Family History  Problem Relation Age of Onset  . Dementia Mother   . Cancer Father        throat  . Cancer Sister        throat   . Cancer Brother        lung  . Cancer Paternal Uncle   . Cancer Paternal Uncle   . Cancer Paternal Uncle   . Cancer Paternal Uncle   . Heart attack Sister   . Diabetes Brother   . Cancer Brother        colon  . Diabetes Brother   . Coronary artery disease Brother   . Coronary artery disease Brother    There is a family history of dementia. Her mother and a maternal aunt both had dementia. She thinks her mother was 73 or so when she became symptomatic, she was not sure about her aunt but guessed it was "around Natalie same age." There is no  family history of  psychiatric illness.  Psychosocial History  Developmental, Educational and Employment History: Natalie Morrow grew up in [place]. She reported a normal childhood development and denied any abuse or neglect. She reported that she did well in school, although she dropped out her sophomore year because she wanted to get married. She did go back to school through a home study course and earned a diploma in that manner. She has worked in a number of different capacities and was working at [employer] until 2 years ago. She had been there for 7.5 years. 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   Psychiatric History: Natalie Morrow denied any history of mental health treatment or difficulties  with depression.   Substance Use History: Natalie Morrow has never consumed much alcohol. She doesn't use tobacco, and she doesn't use any illicit substances.   Relationship History and Living Cimcumstances: Natalie Morrow and her husband have been married for 54 years. They have a son who lives in [place].   Mental Status and Behavioral Observations  Sensorium/Arousal: Natalie Morrow's level of arousal was awake and alert. Hearing and vision were adequate for testing purposes. Orientation: Natalie Morrow was generally alert and oriented to person, place, time, and situation. She did get disoriented in Natalie clinic when returning from Natalie bathroom.  Appearance: Dressed in appropriate, casual clothing with reasonable grooming and hygiene. Behavior: Pleasant, appropriate, and cooperative with test procedures Speech/language: Natalie Morrow's speech was normal in rate, rhythm, and volume.  Gait/Posture: Normal gait and station on exam with Dr. Everlena Cooper Movement: No overt signs of movement disorder evident on observation.  Social Comportment: Pleasant and appropriate Mood: Not clearly described by Natalie Morrow Affect: Neutral to euthymic Thought process/content: Natalie Morrow's thought process did have issues, she seemed  to lose track of what she was saying at times. Nevertheless she was able to respond to questions appropriately most of Natalie time and did not present as acutely disoriented. No formal thought disorder noted. Thought content was appropriate to Natalie evaluative context.  Safety: No safety concerns identified in this euthymic to neutral Morrow.  Insight: Questionable  MMSE - Mini Mental State Exam 10/28/2020  Orientation to time 5  Orientation to Place 5  Registration 3  Attention/ Calculation 3  Recall 0  Language- name 2 objects 2  Language- repeat 1  Language- follow 3 step command 1  Language- read & follow direction 1  Write a sentence 1  Copy design 0  Total score 22   Test Procedures  Wide Range Achievement Test - 4             Word Reading Repeatable Battery for Natalie Assessment of Neuropsychological Status (Form A)  Figure Copy  Judgment of Line Orientation  Coding  Figure Recall Controlled Oral Word Association (F-A-S) Semantic Fluency (Animals) Trail Making Test A & B Complex Ideational Material Clock Drawing [place] Drawing Geriatric Depression Scale - Short Form Quick Dementia Rating System (completed by husband, [name])  Plan  Natalie Morrow was seen for a psychiatric diagnostic evaluation and neuropsychological testing. She is a pleasant 70 year old, left-hand dominant woman referred by Dr. Everlena Cooper. She presented to Natalie ED with presumed delirium on 08/05/2020 and was found to have a UTI, which was treated, and her mental status has unfortunately not returned to baseline. Her husband largely denies difficulties in any area but does admit that she gets confused about who he is and will think he is not her husband, suggesting that Natalie history is likely underreported. He does have a hard time talking about these things in her presence as per his admission. She is screening in Natalie mild dementia range although if this is in fact an acute change, dementia is of course  inappropriate terminology for her issues. Full and complete note with impressions, recommendations, and interpretation of test data to follow.   Bettye Boeck Roseanne Reno, PsyD, ABN Clinical Neuropsychologist  Informed Consent and Coding/Compliance  Risks and benefits of Natalie evaluation were discussed with Natalie Morrow prior to all testing procedures. I conducted a clinical interview and neuropsychological testing (at least two tests) with Natalie Minister and Natalie Morrow, B.S. (Technician) administered additional test procedures. Natalie Morrow was  able to tolerate Natalie testing procedures and Natalie Morrow (and/or family if applicable) is likely to benefit from further follow up to receive Natalie diagnosis and treatment recommendations, which will be rendered at Natalie next encounter. Billing below reflects technician time, my direct face-to-face time with Natalie Morrow, time spent in test administration, and time spent in professional activities including but not limited to: neuropsychological test interpretation, integration of neuropsychological test data with clinical history, report preparation, treatment planning, care coordination, and review of diagnostically pertinent medical history or studies.   Services associated with this encounter: Clinical Interview [phone]) plus 60 minutes (52481; Neuropsychological Evaluation by Professional)  100 minutes (85909; Neuropsychological Evaluation by Professional, Adl.) 22 minutes (31121; Test Administration by Professional) 30 minutes (62446; Neuropsychological Testing by Technician) 55 minutes (95072; Neuropsychological Testing by Technician, Adl.)

## 2020-10-29 NOTE — Progress Notes (Signed)
NEUROPSYCHOLOGICAL EVALUATION [facility]  Patient Name: Natalie Morrow MRN: [id number] Date of Birth: 04/28/1951 Age: 70 y.o. Education: 12 years  Clinical Impressions  Natalie Morrow is a 70 y.o., left-hand dominant, married woman with a history of suspected delirium. She presented to the ED on August 05, 2020 after not recognizing her husband and was found to have a UTI. She was treated with antibiotics but remains with cognitive changes and intermittent confusion about who her husband is. She is being worked up by Dr. Everlena Cooper who ordered an EEG, which revealed intermittent mild focal left temporal slowing yet was otherwise normal. 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 She and her husband were not particularly detailed historians. He noticed no problems prior to her suspected delirium and does not notice many day-to-day problems at present, with the exception of her getting confused about who he is.   On neuropsychological testing, [name] demonstrated overall cognitive performance below expectations for her, with her RBANS Total Scale Score falling in the unusually low range. She had difficulty on measures of memory encoding and retrieval and had low scores on most measures of executive function. Her memory problems do not present a clear storage pattern, given that her encoding was low and she then retained information reasonably well across time. She had no findings viewed as particular to Alzheimer's with reasonable visual object confrontation naming and the opposite pattern expected on verbal fluency measures. She screened negative for the presence of depression and was characterized as functioning at more of an MCI than a dementia level by her husband on severity status staging.   [name] is thus  demonstrating cognitive impairment mainly involving problems with memory encoding and executive function. Her fluctuating course and periods of confusion followed by periods of relative lucidity are more concerning for recurrent delirium than dementia, and her test data would also go well with post-acute persisting cognitive impairment (assuming she was having a good day at the time of assessment). Nevertheless, it does not seem that there is a compelling medical explanation for that. 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 So long as underlying medical causes have been eliminated, I would recommend re-testing in 1 year to follow her progress over time.   Diagnostic Impressions: Cognitive dysfunction  Recommendations to be discussed with patient  Your performance and presentation on assessment were consistent with some degree of cognitive problems, mainly involving memory formation and executive function, which is a higher-order set of cognitive abilities involved in regulating attention, problem solving, planning, and organizing behavior effectively to meet the demands of the environment. The extent of the difficulties you demonstrated is overall relatively mild.   In terms of the cause of your cognitive problems, I am not sure. You likely experienced an episode of delirium, which is a transient alternation in cognitive function due to an underlying medical issue. Delirium is common with UTI in the elderly, and that was the likely cause of your delirium. It is curious, however, that you remain with intermittent cognitive problems resembling a delirium, because the condition typically clears with treatment of the underlying cause. Individuals can  and do continue to have measurable cognitive declines  following delirium, although these are typically mild. The fact that you continue to misidentify your husband, which is a sign of serious cognitive problems, is not typical.   Dr. Everlena Cooper was concerned that you may have an underlying dementia, which makes individuals more susceptible to delirium and also can cause the type of problems that you are demonstrating. Your test data, however, do not look like the most common cause of dementia in individuals your age (Alzheimer's disease), nor do they resemble other dementia syndromes that are readily detectable on neuropsychological testing. Your cognitive test data fit well with an ongoing mild level problem that may represent some residual delirium. It is also possible that you had some degree of mild cognitive impairment prior to your delirium and that predisposed you, but I do not think you had preexisting dementia.   My best advice would be to make sure that any underlying medical causes of delirium have been definitively ruled out. If that is the case, then I would suggest that you focus on maintaining tight control of health behaviors, which may help your body regain equilibrium. This includes getting recommended amounts of sleep (around 8 hours for most people), optimal hydration such as drinking at least 64oz of clear fluid per day, good nutrition, and making sure you are getting some exercise and physical activity. Your family can help by reorienting you if you become confused. Make sure to avoid putting brain altering substances into your body, such as alcohol, which you are already doing.   I would like your husband to know that you mistaking him is not by choice and is a sign that there is something wrong with your brain. This is not a psychological issue, in my opinion, and is the sign of an underlying condition or medical problem that has yet to declare itself. While this can be upsetting to family members, the best way to deal with it is to realize  that it is part of the illness and move on rather than taking it personally.   Instead of medications, I recommend behavioral strategies for dealing with the behavioral and psychological issues that can accompany cognitive disorders. Things like agitation, wandering, and anxiety can often be improved or eliminated using the "three R's." Redirection (help distract your loved one by focusing their attention on something else, moving them to a new environment, or otherwise engaging them in something other than what is distressing to them), Reassurance (reassure them that you are there to take care of them and that there is nothing they need to be worried about), and Reconsidering (consider the situation from their perspective and try to identify if there is something about the situation or environment that may be triggering their reaction).  I suggest that you follow up in 1 year to monitor your progress over time. If there is a neurodegenerative condition such as Alzheimer's disease, it will become clear over time.   Test Findings  Test scores are summarized in additional documentation associated with this encounter. Test scores are relative to age, gender, and educational history as available and appropriate. There were no concerns about performance validity as all findings fell within normal expectations.   General Intellectual Functioning/Achievement:  Performance on single word reading was average, which presents as a reasonable standard of comparison for Ms. Sherbert's cognitive test performance.   Attention and Processing Efficiency: Performance on indicators of attention and processing efficiency was low average, although the patient did  have mild difficulties on supplemental measures of working memory. Digit repetition forward was average. Timed number symbol coding was low average. Serial subtration of 7 from 100 was 3/5.   Language: Performance on language measures was reasonable with the  exception of timed fluency measures. Visual object confrontation naming was within normal limits on two different measures. Timed generation of words was extremely low in response to the letters F-A-S whereas generation of words in a given category was extremely low for "fruits and vegetables" and low average in response to the category prompt "animals."   Visuospatial Function: Performance on visuospatial and constructional measures was average, overall. Figure copy was near errorless although with poor line quality due to an apparent tremor. Judgment of angular line orientations was average. She did have difficulty on [place], with an unusually low score, but I think that is mostly on the basis of planning problems.   Learning and Memory: Performance on measures of learning and memory was low, although the pattern shows weaker encoding than delayed recall in most cases and is thus not suggestive of a storage problem.   In the verbal realm, immediate recall for a 10-item word list was extremely low and immediate recall of a short story was extremely low. Delayed recall of the word list was low average, however, with reasonable retention of information over time. Delayed recognition of the word list was low average. Delayed recall for the short story was unusually low although she recalled 75% of the material she initially encoded.   In the visual realm, delayed recall was extremely low.   Executive Functions: Performance on executive measures was generally low, suggestive of significant executive difficulties. Clock drawing was consistent with "moderate impairment," with no face, only a 3, 6, 9, and 12 for numbers, and hands markedly out of course. Generation of words in response to the letters F-A-S was extremely low. Alternating sequencing of numbers and letters of alphabet was unusually low. She did better listening to and then reasoning with verbal information on the Complex Ideational  Material, which was low average.   Rating Scale(s): Ms. Testerman was characterized as functioning at more of an MCI than a dementia level of function by her husband. She screened negative for the presence of depression.   Bettye Boeck Roseanne Reno PsyD, ABN Clinical Neuropsychologist

## 2020-11-04 ENCOUNTER — Ambulatory Visit (INDEPENDENT_AMBULATORY_CARE_PROVIDER_SITE_OTHER): Payer: PPO | Admitting: Counselor

## 2020-11-04 ENCOUNTER — Other Ambulatory Visit: Payer: Self-pay

## 2020-11-04 ENCOUNTER — Encounter: Payer: Self-pay | Admitting: Counselor

## 2020-11-04 DIAGNOSIS — F09 Unspecified mental disorder due to known physiological condition: Secondary | ICD-10-CM

## 2020-11-04 NOTE — Patient Instructions (Signed)
Your performance and presentation on assessment were consistent with some degree of cognitive problems, mainly involving memory formation and executive function, which is a higher-order set of cognitive abilities involved in regulating attention, problem solving, planning, and organizing behavior effectively to meet the demands of the environment. The extent of the difficulties you demonstrated is overall relatively mild.   In terms of the cause of your cognitive problems, I am not sure. You likely experienced an episode of delirium, which is a transient alternation in cognitive function due to an underlying medical issue. Delirium is common with UTI in the elderly, and that was the likely cause of your delirium. It is curious, however, that you remain with intermittent cognitive problems resembling a delirium, because the condition typically clears with treatment of the underlying cause. Individuals can and do continue to have measurable cognitive declines following delirium, although these are typically mild. The fact that you continue to misidentify your husband, which is a sign of serious cognitive problems, is not typical.   Dr. Everlena Cooper was concerned that you may have an underlying dementia, which makes individuals more susceptible to delirium and also can cause the type of problems that you are demonstrating. Your test data, however, do not look like the most common cause of dementia in individuals your age (Alzheimer's disease), nor do they resemble other dementia syndromes that are readily detectable on neuropsychological testing. Your cognitive test data fit well with an ongoing mild level problem that may represent some residual delirium. It is also possible that you had some degree of mild cognitive impairment prior to your delirium and that predisposed you, but I do not think you had preexisting dementia.   My best advice would be to make sure that any underlying medical causes of delirium have  been definitively ruled out. If that is the case, then I would suggest that you focus on maintaining tight control of health behaviors, which may help your body regain equilibrium. This includes getting recommended amounts of sleep (around 8 hours for most people), optimal hydration such as drinking at least 64oz of clear fluid per day, good nutrition, and making sure you are getting some exercise and physical activity. Your family can help by reorienting you if you become confused. Make sure to avoid putting brain altering substances into your body, such as alcohol, which you are already doing.   I would like your husband to know that you mistaking him is not by choice and is a sign that there is something wrong with your brain. This is not a psychological issue, in my opinion, and is the sign of an underlying condition or medical problem that has yet to declare itself. While this can be upsetting to family members, the best way to deal with it is to realize that it is part of the illness and move on rather than taking it personally.   Instead of medications, I recommend behavioral strategies for dealing with the behavioral and psychological issues that can accompany cognitive disorders. Things like agitation, wandering, and anxiety can often be improved or eliminated using the "three R's." Redirection (help distract your loved one by focusing their attention on something else, moving them to a new environment, or otherwise engaging them in something other than what is distressing to them), Reassurance (reassure them that you are there to take care of them and that there is nothing they need to be worried about), and Reconsidering (consider the situation from their perspective and try to identify if there  is something about the situation or environment that may be triggering their reaction).  I suggest that you follow up in 1 year to monitor your progress over time. If there is a neurodegenerative condition  such as Alzheimer's disease, it will become clear over time.

## 2020-11-04 NOTE — Progress Notes (Signed)
   NEUROPSYCHOLOGY FEEDBACK NOTE [facility]  Feedback Note: I met with Natalie Morrow to review the findings resulting from her neuropsychological evaluation. She presented with her son, Natalie Morrow. Since the last appointment, she has been about the same.Time was spent reviewing the impressions and recommendations that are detailed in the evaluation report. We discussed impression of ongoing mild delirium and/or post-acute persisting cognitive impairment. We talked at length about the fact that her case is somewhat atypical, as essentially all individuals with delirium recover after the underlying medical cause has been treated, yet she continues to manifest what sounds like fairly significant confusion. It is possible she has an atypical presentation of neurodegeneration but I am unsure what that would be given her test data, clinical history, and it seems like this is more of an acute issue. Other topics of conversation as reflected in the patient instructions. I took time to explain the findings and answer all the patient's questions. I encouraged Natalie Morrow to contact me should she have any further questions or if further follow up is desired.   Current Medications and Medical History   Current Outpatient Medications  Medication Sig Dispense Refill  . acetaminophen (TYLENOL) 500 MG tablet Take 500 mg by mouth as needed for mild pain.     . Famotidine (PEPCID AC PO) Take by mouth daily.    Marland Kitchen levothyroxine (SYNTHROID, LEVOTHROID) 50 MCG tablet Take 50 mcg by mouth daily.    Marland Kitchen loratadine (CLARITIN) 10 MG tablet Take 10 mg by mouth daily as needed for allergies. (Patient not taking: Reported on 09/19/2020)    . Probiotic Product (ALIGN) 4 MG CAPS Take 4 mg by mouth 2 (two) times a week. (Patient not taking: Reported on 09/19/2020)     No current facility-administered medications for this visit.    Patient Active Problem List   Diagnosis Date Noted  . Cognitive dysfunction 11/04/2020  .  Screening for colorectal cancer 12/07/2019  . Encounter for well woman exam with routine gynecological exam 12/07/2019  . Family hx of colon cancer 02/17/2018  . Rectocele 10/12/2017  . Well female exam with routine gynecological exam 10/12/2017  . Vaginal dryness 10/12/2017  . AK (actinic keratosis) 09/17/2015    Mental Status and Behavioral Observations  Natalie Morrow presented on time to the present encounter and was alert and generally oriented. Speech was normal in rate, rhythm, volume, and prosody. Self-reported mood was "allright" and affect was neutral with an undercurrent of anxiety. Thought process was meandering, she had a hard time finishing sentences and thought content was appropriate to the topics discussed. There were no safety concerns identified at today's encounter, such as thoughts of harming self or others.   Plan  Feedback provided regarding the patient's neuropsychological evaluation. She seemingly has ongoing low level hypoactive delirium, although medical workup has been unrevealing. One possibility is it is just taking her a long time to regain equilibrium. Her son does notice a significant correlation between her sleep and her cognitive abilities. Encouraged optimizing healthy lifestyle. Natalie Morrow was encouraged to contact me if any questions arise or if further follow up is desired.   Viviano Simas Nicole Kindred, PsyD, ABN Clinical Neuropsychologist  Service(s) Provided at This Encounter: 45 minutes [phone]; Conjoint therapy with patient present)

## 2020-11-05 ENCOUNTER — Ambulatory Visit: Payer: PPO | Admitting: Neurology

## 2020-12-10 DIAGNOSIS — Z20828 Contact with and (suspected) exposure to other viral communicable diseases: Secondary | ICD-10-CM | POA: Diagnosis not present

## 2020-12-10 DIAGNOSIS — R350 Frequency of micturition: Secondary | ICD-10-CM | POA: Diagnosis not present

## 2020-12-10 DIAGNOSIS — J0101 Acute recurrent maxillary sinusitis: Secondary | ICD-10-CM | POA: Diagnosis not present

## 2021-01-14 DIAGNOSIS — D509 Iron deficiency anemia, unspecified: Secondary | ICD-10-CM | POA: Diagnosis not present

## 2021-01-14 DIAGNOSIS — E7849 Other hyperlipidemia: Secondary | ICD-10-CM | POA: Diagnosis not present

## 2021-01-14 DIAGNOSIS — E039 Hypothyroidism, unspecified: Secondary | ICD-10-CM | POA: Diagnosis not present

## 2021-01-14 DIAGNOSIS — E559 Vitamin D deficiency, unspecified: Secondary | ICD-10-CM | POA: Diagnosis not present

## 2021-01-14 DIAGNOSIS — E782 Mixed hyperlipidemia: Secondary | ICD-10-CM | POA: Diagnosis not present

## 2021-01-17 DIAGNOSIS — N189 Chronic kidney disease, unspecified: Secondary | ICD-10-CM | POA: Diagnosis not present

## 2021-01-17 DIAGNOSIS — E7849 Other hyperlipidemia: Secondary | ICD-10-CM | POA: Diagnosis not present

## 2021-01-17 DIAGNOSIS — R42 Dizziness and giddiness: Secondary | ICD-10-CM | POA: Diagnosis not present

## 2021-01-17 DIAGNOSIS — M722 Plantar fascial fibromatosis: Secondary | ICD-10-CM | POA: Diagnosis not present

## 2021-01-17 DIAGNOSIS — T466X5A Adverse effect of antihyperlipidemic and antiarteriosclerotic drugs, initial encounter: Secondary | ICD-10-CM | POA: Diagnosis not present

## 2021-01-17 DIAGNOSIS — R7301 Impaired fasting glucose: Secondary | ICD-10-CM | POA: Diagnosis not present

## 2021-01-17 DIAGNOSIS — F0390 Unspecified dementia without behavioral disturbance: Secondary | ICD-10-CM | POA: Diagnosis not present

## 2021-01-17 DIAGNOSIS — M791 Myalgia, unspecified site: Secondary | ICD-10-CM | POA: Diagnosis not present

## 2021-01-20 NOTE — Progress Notes (Signed)
NEUROLOGY FOLLOW UP OFFICE NOTE  Natalie Morrow [id number]  Assessment/Plan:   Episode of acute delirium, still with some cognitive deficits - so far testing does not demonstrate any clear underlying neurodegenerative disorder.  One of the notable symptoms is intermittent prosopagnosia - does not recognize her husband but more recently has recognized his voice.  I would like to repeat tests to evaluate for any alternative etiology.  1.  MRI of brain with and without contrast 2.  72 hour ambulatory EEG 3.  Check labs:  B12, MMA, folate, TSH, RPR 4.  Pending results, future testing may include PET scan of brain vs LP for CSF analysis 5.  No driving 6.  Otherwise, follow up 6 months.  Subjective:  Natalie Morrow. Wayment is a 70 year old left-handed female with hypothyroidism who follows up for acute delirium.  She is accompanied by her husband who supplements history.    UPDATE: EEG on 09/25/2020 showed mild left temporal slowing.  She underwent neuropsychological testing on 10/28/2020 showed mild cognitive deficits in memory and executive function but did not meet criteria for dementia.    Still has episodes where she may not recognize her husband.  It may be brief or last longer.  She says it has improved somewhat over the past 2 weeks because she may recognize his voice.  Also reports word-finding difficulty.  No longer driving.  HISTORY: On the evening of 08/04/2020, she did not recognize her husband.  She wasn't agitated but didn't want to sleep in the same bed and asked him to leave the house.  The next morning, she still didn't recognize her husband.  Her son came over and she did recognize her son.  They brought her to the ED.  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  CMP showed normal kidney and liver function with no electrolyte abnormalities.  TSH was 1.687.  WBC on CBC  was normal.  UA was positive for UTI.  She was discharged on antibiotics.  B12, folate and RPR reportedly normal.  Over the next several weeks, symptoms slowly improved but have not completely resolved.  She will still sometimes briefly not recognize her husband.  On one occasion, she did not really recognize her son either.  She has still been able to perform all ADLs however she has not been driving since this started.  No headache, fever, cough.  Prior to onset, she had been experiencing dizziness/vertigo.  Also, she appeared to not be as talkative on the phone.  She has been under increased stress caring for her husband who has been having worsening kidney disease and may need to start dialysis.  Mother and maternal aunt dementia.    PAST MEDICAL HISTORY: Past Medical History:  Diagnosis Date  . AK (actinic keratosis) 09/17/2015  . Arthritis   . Bulging lumbar disc   . Bursitis of hip   . Fibromyalgia   . GERD (gastroesophageal reflux disease)   . Hormone replacement therapy (HRT) 09/05/2013  . Hypercholesteremia   . Hypothyroidism   . Plantar fasciitis, bilateral   . Rosacea     MEDICATIONS: Current Outpatient Medications on File Prior to Visit  Medication Sig Dispense Refill  . acetaminophen (TYLENOL) 500 MG tablet Take 500 mg by mouth as needed for mild pain.     . Famotidine (PEPCID AC PO) Take by mouth daily.    Marland Kitchen levothyroxine (SYNTHROID,  LEVOTHROID) 50 MCG tablet Take 50 mcg by mouth daily.    Marland Kitchen loratadine (CLARITIN) 10 MG tablet Take 10 mg by mouth daily as needed for allergies. (Patient not taking: Reported on 09/19/2020)    . Probiotic Product (ALIGN) 4 MG CAPS Take 4 mg by mouth 2 (two) times a week. (Patient not taking: Reported on 09/19/2020)     No current facility-administered medications on file prior to visit.    ALLERGIES: Allergies  Allergen Reactions  . Crestor [Rosuvastatin] Itching    All statins  . Sulfa Antibiotics Swelling    FAMILY HISTORY: Family  History  Problem Relation Age of Onset  . Dementia Mother   . Cancer Father        throat  . Cancer Sister        throat   . Cancer Brother        lung  . Cancer Paternal Uncle   . Cancer Paternal Uncle   . Cancer Paternal Uncle   . Cancer Paternal Uncle   . Heart attack Sister   . Diabetes Brother   . Cancer Brother        colon  . Diabetes Brother   . Coronary artery disease Brother   . Coronary artery disease Brother       Objective:  Blood pressure (!) 148/83, pulse 100, height 5\' 6"  (1.676 m), weight 131 lb (59.4 kg), SpO2 96 %. General: No acute distress.  Patient appears well-groomed.     , DO  CC: Shon Millet, MD

## 2021-01-21 ENCOUNTER — Ambulatory Visit: Payer: PPO | Admitting: Neurology

## 2021-01-21 ENCOUNTER — Other Ambulatory Visit: Payer: Self-pay

## 2021-01-21 ENCOUNTER — Other Ambulatory Visit (INDEPENDENT_AMBULATORY_CARE_PROVIDER_SITE_OTHER): Payer: PPO

## 2021-01-21 ENCOUNTER — Encounter: Payer: Self-pay | Admitting: Neurology

## 2021-01-21 VITALS — BP 148/83 | HR 100 | Ht 66.0 in | Wt 131.0 lb

## 2021-01-21 DIAGNOSIS — F09 Unspecified mental disorder due to known physiological condition: Secondary | ICD-10-CM

## 2021-01-21 DIAGNOSIS — R410 Disorientation, unspecified: Secondary | ICD-10-CM

## 2021-01-21 LAB — B12 AND FOLATE PANEL
Folate: >24.4 ng/mL (ref >5.9)
Vitamin B-12: 191 pg/mL — ABNORMAL LOW (ref 211–911)

## 2021-01-21 LAB — TSH: TSH: 1.89 u[IU]/mL (ref 0.35–4.50)

## 2021-01-21 NOTE — Patient Instructions (Signed)
1.  MRI of brain with and without contrast 2.  72 hour ambulatory EEG 3.  Check labs:  B12, methylmalonic acid, folate, TSH, RPR 4.  No driving 5.  Follow up in 6 months or as needed

## 2021-01-23 NOTE — Progress Notes (Signed)
NO PA required

## 2021-01-24 LAB — RPR: RPR Ser Ql: NONREACTIVE

## 2021-01-24 LAB — METHYLMALONIC ACID, SERUM: Methylmalonic Acid, Quant: 329 nmol/L — ABNORMAL HIGH (ref 87–318)

## 2021-01-29 ENCOUNTER — Telehealth: Payer: Self-pay | Admitting: *Deleted

## 2021-01-29 NOTE — Telephone Encounter (Signed)
Her husband Jonny Ruiz called and said they are going to schedule the ambulatory EEG so we scheduled it for May 23rd

## 2021-01-29 NOTE — Telephone Encounter (Signed)
Spoke with Natalie Morrow to schedule 72 hour EEG. She said she has a lot going on right now and cannot do it. She stated that she would need to get someone to drive her here and back again. She asked if she could do it at the hospital I asked "to be admitted for 3 days?" she said no but to do another routine EEG. She asked of it was necessary, I told her Natalie Morrow ordered it because he felt like it was. She asked if she could delay it.  She is scheduled for an MRI in a few days, I told her I would convey the phone call to Natalie Morrow and when he calls with MRI test results they can discuss the ambulatory EEG. She said ok.

## 2021-02-02 ENCOUNTER — Other Ambulatory Visit: Payer: Self-pay

## 2021-02-02 ENCOUNTER — Ambulatory Visit
Admission: RE | Admit: 2021-02-02 | Discharge: 2021-02-02 | Disposition: A | Discharge status: Home / Self Care | Payer: PPO | Source: Ambulatory Visit | Attending: Neurology | Admitting: Neurology

## 2021-02-02 DIAGNOSIS — R410 Disorientation, unspecified: Secondary | ICD-10-CM

## 2021-02-02 DIAGNOSIS — J341 Cyst and mucocele of nose and nasal sinus: Secondary | ICD-10-CM | POA: Diagnosis not present

## 2021-02-02 DIAGNOSIS — I6782 Cerebral ischemia: Secondary | ICD-10-CM | POA: Diagnosis not present

## 2021-02-02 DIAGNOSIS — J3489 Other specified disorders of nose and nasal sinuses: Secondary | ICD-10-CM | POA: Diagnosis not present

## 2021-02-02 DIAGNOSIS — F09 Unspecified mental disorder due to known physiological condition: Secondary | ICD-10-CM

## 2021-02-02 DIAGNOSIS — G3184 Mild cognitive impairment, so stated: Secondary | ICD-10-CM | POA: Diagnosis not present

## 2021-02-03 ENCOUNTER — Ambulatory Visit: Payer: PPO | Admitting: Neurology

## 2021-02-03 DIAGNOSIS — R410 Disorientation, unspecified: Secondary | ICD-10-CM | POA: Diagnosis not present

## 2021-02-03 NOTE — Progress Notes (Signed)
Pt advised.

## 2021-02-07 ENCOUNTER — Telehealth: Payer: Self-pay

## 2021-02-07 DIAGNOSIS — R270 Ataxia, unspecified: Secondary | ICD-10-CM

## 2021-02-07 DIAGNOSIS — F0280 Dementia in other diseases classified elsewhere without behavioral disturbance: Secondary | ICD-10-CM

## 2021-02-07 NOTE — Telephone Encounter (Signed)
-----   Message from Drema Dallas, DO sent at 02/07/2021 11:44 AM EDT ----- EEG shows some slowing but no evidence of seizures.  If we can get approved, I would like to try and get a PET scan of the brain to evaluate for evidence of neurodegenerative disease.

## 2021-02-07 NOTE — Procedures (Signed)
ELECTROENCEPHALOGRAM REPORT  Dates of Recording: 02/03/2021 at 11:15 to 02/06/2021 at 11:38  Patient's Name: Natalie Morrow MRN: [id number] Date of Birth: 1951-02-22  Referring Provider: Fara Chute, MD  Procedure: 72-hour ambulatory EEG  History: 70 year old female with episodes of confusion where she does not recognize her husband.  Medications:  TYLENOL 500 MG tablet PEPCID AC PO SYNTHROID, LEVOTHROID 50 MCG tablet CLARITIN 10 MG tablet ALIGN 4 MG CAPS  Technical Summary: This is a 72-hour multichannel digital EEG recording measured by the international 10-20 system with electrodes applied with paste and impedances below 5000 ohms performed as portable with EKG monitoring.  The digital EEG was referentially recorded, reformatted, and digitally filtered in a variety of bipolar and referential montages for optimal display.    DESCRIPTION OF RECORDING: During maximal wakefulness, the background activity consisted of generalized theta activity (sometimes left temporal predominant) and a symmetric 9Hz  posterior dominant rhythm which was reactive to eye opening.  There were no epileptiform discharges or focal slowing seen in wakefulness.  During the recording, the patient progresses through wakefulness, drowsiness, and Stage 2 sleep.  Again, there were no epileptiform discharges seen.  Events:  There were no electrographic seizures seen.  EKG lead was unremarkable.  IMPRESSION: This 72-hour ambulatory EEG study is abnormal due to mild generalized slowing with occasional mild left temporal predominance.      CLINICAL CORRELATION: the above findings is suggesting of a diffuse cerebral dysfunction and possible underlying focal abnormality over the left temporal region.   , DO

## 2021-02-07 NOTE — Telephone Encounter (Signed)
Pt advised of results. Order added. Will start Pa process.

## 2021-02-12 ENCOUNTER — Telehealth: Payer: Self-pay | Admitting: Neurology

## 2021-02-12 NOTE — Telephone Encounter (Signed)
Patient's husband called in stating they never heard from anyone about getting the PET scan done.

## 2021-02-13 NOTE — Telephone Encounter (Signed)
Still pending effective 02/13/2021

## 2021-02-13 NOTE — Telephone Encounter (Signed)
Pt husband advised we are working on this as we speak.   Will call with appt date and time or if the insurance want approve.

## 2021-02-14 ENCOUNTER — Telehealth: Payer: Self-pay

## 2021-02-14 NOTE — Telephone Encounter (Signed)
Patient is scheduled at [facility] long for [place] pet metabolic brain no prior needed. June 20 at [facility] check by 930. No precert needed. Patient advise and ordered. [phone]. NPO 6 hours.prior

## 2021-03-03 ENCOUNTER — Encounter (HOSPITAL_COMMUNITY): Payer: PPO

## 2021-03-05 NOTE — Telephone Encounter (Signed)
PA and appointment complete.

## 2021-03-10 ENCOUNTER — Telehealth: Payer: Self-pay

## 2021-03-10 ENCOUNTER — Encounter (HOSPITAL_COMMUNITY)
Admission: RE | Admit: 2021-03-10 | Discharge: 2021-03-10 | Disposition: A | Discharge status: Home / Self Care | Payer: PPO | Source: Ambulatory Visit | Attending: Neurology | Admitting: Neurology

## 2021-03-10 ENCOUNTER — Other Ambulatory Visit: Payer: Self-pay

## 2021-03-10 DIAGNOSIS — R252 Cramp and spasm: Secondary | ICD-10-CM | POA: Insufficient documentation

## 2021-03-10 DIAGNOSIS — R270 Ataxia, unspecified: Secondary | ICD-10-CM | POA: Diagnosis not present

## 2021-03-10 DIAGNOSIS — G319 Degenerative disease of nervous system, unspecified: Secondary | ICD-10-CM | POA: Diagnosis not present

## 2021-03-10 DIAGNOSIS — G3184 Mild cognitive impairment, so stated: Secondary | ICD-10-CM | POA: Diagnosis not present

## 2021-03-10 DIAGNOSIS — F0280 Dementia in other diseases classified elsewhere without behavioral disturbance: Secondary | ICD-10-CM | POA: Insufficient documentation

## 2021-03-10 LAB — GLUCOSE, CAPILLARY: Glucose-Capillary: 115 mg/dL — ABNORMAL HIGH (ref 70–99)

## 2021-03-10 MED ORDER — FLUDEOXYGLUCOSE F - 18 (FDG) INJECTION
10.3000 | Freq: Once | INTRAVENOUS | Status: AC | PRN
  Administered 2021-03-10: 10.3 via INTRAVENOUS

## 2021-03-10 NOTE — Telephone Encounter (Signed)
IMPRESSION: Decreased relative cortical metabolism within the LEFT parietal lobe is a pattern which can be found in Alzheimer's type pathology.   Of note, FDG metabolic brain imaging is an adjuvant test used as in conjunction with clinical evaluation in assessing cognitive impairment.

## 2021-03-14 NOTE — Progress Notes (Signed)
NEUROLOGY FOLLOW UP OFFICE NOTE  [name] [id number]  Assessment/Plan:    Mild neurocognitive disorder, secondary to Alzheimer's  Anxiety Dizziness - chronic.  Has history of vertigo.   Discussed PET scan results and what it implies.  Discussed recommendations, including establishing POA (it his her son). 1.Start donepezil 5mg  at bedtime for a month.  If tolerating, will increase to 10mg  at bedtime 2.Once she has been on donepezil 10mg  at bedtime for four weeks, will start escitalopram 5mg  daily to address anxiety (may also help dizziness) 3. Refer to vestibular rehab 4. Provided Alzheimer's information/resources 5. No driving 6. Follow up in 6 months with , PA-C  Subjective:  . Natalie Morrow is a 70 year old left-handed female with hypothyroidism who follows up for mild cognitive impairment.  She is accompanied by her son.   UPDATE: PET scan of brain on 03/10/2021 personally reviewed showed decreased relative cortical metabolism within the left parietal lobe as may be seen in Alzheimer's type pathology.   HISTORY: On the evening of 08/04/2020, she did not recognize her husband.  She wasn't agitated but didn't want to sleep in the same bed and asked him to leave the house.  The next morning, she still didn't recognize her husband.  Her son came over and she did recognize her son.  They brought her to the ED.  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  CMP showed normal kidney and liver function with no electrolyte abnormalities.  TSH was 1.687.  WBC on CBC was normal.  UA was positive for UTI.  She was discharged on antibiotics.  B12, folate and RPR reportedly normal.  Over the next several weeks, symptoms slowly improved but have not completely resolved.  She will still sometimes briefly not recognize her husband.  On one occasion, she did not really  recognize her son either.  She has still been able to perform all ADLs however she has not been driving since this started.  No headache, fever, cough.  Prior to onset, she had been experiencing dizziness/vertigo.  Also, she appeared to not be as talkative on the phone.  She has been under increased stress caring for her husband who has been having worsening kidney disease and may need to start dialysis.  EEG on 09/25/2020 showed mild left temporal slowing.  She underwent neuropsychological testing on 10/28/2020 showed mild cognitive deficits in memory and executive function but did not meet criteria for dementia.     Mother and maternal aunt dementia. Her sister has been experiencing symptoms as well.  PAST MEDICAL HISTORY: Past Medical History:  Diagnosis Date   AK (actinic keratosis) 09/17/2015   Arthritis    Bulging lumbar disc    Bursitis of hip    Fibromyalgia    GERD (gastroesophageal reflux disease)    Hormone replacement therapy (HRT) 09/05/2013   Hypercholesteremia    Hypothyroidism    Plantar fasciitis, bilateral    Rosacea     MEDICATIONS: Current Outpatient Medications on File Prior to Visit  Medication Sig Dispense Refill   acetaminophen (TYLENOL) 500 MG tablet Take 500 mg by mouth as needed for mild pain.      cholecalciferol (VITAMIN D3) 25 MCG (1000 UNIT) tablet Take 1,000 Units by mouth daily.     Famotidine (PEPCID AC PO) Take by mouth daily.     levothyroxine (SYNTHROID, LEVOTHROID) 50 MCG tablet Take 50  mcg by mouth daily.     loratadine (CLARITIN) 10 MG tablet Take 10 mg by mouth daily as needed for allergies.     Probiotic Product (ALIGN) 4 MG CAPS Take 4 mg by mouth 2 (two) times a week. (Patient not taking: No sig reported)     No current facility-administered medications on file prior to visit.    ALLERGIES: Allergies  Allergen Reactions   Crestor [Rosuvastatin] Itching    All statins   Sulfa Antibiotics Swelling    FAMILY HISTORY: Family History   Problem Relation Age of Onset   Dementia Mother    Cancer Father        throat   Cancer Sister        throat    Cancer Brother        lung   Cancer Paternal Uncle    Cancer Paternal Uncle    Cancer Paternal Uncle    Cancer Paternal Uncle    Heart attack Sister    Diabetes Brother    Cancer Brother        colon   Diabetes Brother    Coronary artery disease Brother    Coronary artery disease Brother       Objective:  Blood pressure 131/75, pulse (!) 101, height 5\' 6"  (1.676 m), weight 129 lb 3.2 oz (58.6 kg), SpO2 95 %. General: No acute distress.  Patient appears well-groomed.    , DO  CC: Shon Millet, MD

## 2021-03-18 ENCOUNTER — Ambulatory Visit: Payer: PPO | Admitting: Neurology

## 2021-03-18 ENCOUNTER — Encounter: Payer: Self-pay | Admitting: Neurology

## 2021-03-18 ENCOUNTER — Other Ambulatory Visit: Payer: Self-pay

## 2021-03-18 VITALS — BP 131/75 | HR 101 | Ht 66.0 in | Wt 129.2 lb

## 2021-03-18 DIAGNOSIS — F0670 Mild neurocognitive disorder due to known physiological condition without behavioral disturbance: Secondary | ICD-10-CM

## 2021-03-18 DIAGNOSIS — R42 Dizziness and giddiness: Secondary | ICD-10-CM | POA: Diagnosis not present

## 2021-03-18 DIAGNOSIS — F419 Anxiety disorder, unspecified: Secondary | ICD-10-CM | POA: Diagnosis not present

## 2021-03-18 DIAGNOSIS — G3184 Mild cognitive impairment, so stated: Secondary | ICD-10-CM | POA: Diagnosis not present

## 2021-03-18 DIAGNOSIS — G309 Alzheimer's disease, unspecified: Secondary | ICD-10-CM

## 2021-03-18 MED ORDER — DONEPEZIL HCL 5 MG PO TABS: 5.0000 mg | ORAL_TABLET | Freq: Every day | ORAL | 0 refills | Status: DC

## 2021-03-18 NOTE — Patient Instructions (Addendum)
We will start donepezil (Aricept) 5mg  daily for four weeks.  If you are tolerating the medication, then after four weeks, we will increase the dose to 10mg  daily.  Side effects include nausea, vomiting, diarrhea, vivid dreams, and muscle cramps.  Please call the clinic if you experience any of these symptoms.   After you have been on donepezil 10mg  at bedtime for a month, contact me and we can start a medication for anxiety  Will refer you for vestibular rehab  No driving

## 2021-03-19 ENCOUNTER — Ambulatory Visit: Payer: PPO | Admitting: Neurology

## 2021-04-02 DIAGNOSIS — H43812 Vitreous degeneration, left eye: Secondary | ICD-10-CM | POA: Diagnosis not present

## 2021-04-02 DIAGNOSIS — H2513 Age-related nuclear cataract, bilateral: Secondary | ICD-10-CM | POA: Diagnosis not present

## 2021-04-10 ENCOUNTER — Other Ambulatory Visit: Payer: Self-pay

## 2021-04-10 ENCOUNTER — Encounter (HOSPITAL_COMMUNITY): Payer: Self-pay | Admitting: Physical Therapy

## 2021-04-10 ENCOUNTER — Ambulatory Visit (HOSPITAL_COMMUNITY): Payer: PPO | Attending: Neurology | Admitting: Physical Therapy

## 2021-04-10 DIAGNOSIS — H8113 Benign paroxysmal vertigo, bilateral: Secondary | ICD-10-CM | POA: Diagnosis not present

## 2021-04-11 NOTE — Therapy (Signed)
[facility] [address] [place], [place], [address] Phone: [phone]   Fax:  [phone]  Physical Therapy Evaluation  Patient Details  Name: Natalie Morrow MRN: [id number] Date of Birth: 25-Oct-1950 Referring Provider (PT): Natalie Morrow   Encounter Date: 04/10/2021   PT End of Session - 04/10/21 1524     Visit Number 1    Number of Visits 8    Date for PT Re-Evaluation 05/10/21    Authorization Type MCR Advance    Progress Note Due on Visit 8    PT Start Time 1445    PT Stop Time 1530    PT Time Calculation (min) 45 min             Past Medical History:  Diagnosis Date   AK (actinic keratosis) 09/17/2015   Arthritis    Bulging lumbar disc    Bursitis of hip    Fibromyalgia    GERD (gastroesophageal reflux disease)    Hormone replacement therapy (HRT) 09/05/2013   Hypercholesteremia    Hypothyroidism    Plantar fasciitis, bilateral    Rosacea     Past Surgical History:  Procedure Laterality Date   BACK SURGERY  05/2015   CHOLECYSTECTOMY     COLONOSCOPY N/A 11/10/2012   Procedure: COLONOSCOPY;  Surgeon: Natalie Hippo, MD;  [facility];  Service: Endoscopy;  Laterality: N/A;  730   COLONOSCOPY WITH PROPOFOL N/A 04/08/2018   Procedure: COLONOSCOPY WITH PROPOFOL;  Surgeon: Natalie Hippo, MD;  [facility];  Service: Endoscopy;  Laterality: N/A;  100   TONSILLECTOMY      There were no vitals filed for this visit.  PT states she has no pain at this time.    04/10/21 0001  Symptom Behavior  Subjective history of current problem Natalie Morrow states that she does not get dizzy every day.  When she does get dizzy it goes away fairly quickly.  Type of Dizziness  "Funny feeling in head";Spinning  Frequency of Dizziness not sure can happen several times a day or several times a week.  Duration of Dizziness 1-2 minutes  Symptom Nature Motion provoked  Aggravating Factors Turning head quickly;Sit  to stand  Relieving Factors Slow movements;Rest  Progression of Symptoms No change since onset  History of similar episodes This has been going on for over a year.  Oculomotor Exam  Oculomotor Alignment Normal  Ocular ROM normal  Smooth Pursuits Saccades (to LT; RT slighlty but not as much as to the LT; up moderate; Down)  Saccades Poor trajectory  Positional Testing  Dix-Hallpike Dix-Hallpike Right;Dix-Hallpike Left  Dix-Hallpike Right  Dix-Hallpike Right Duration 5 seconds  Dix-Hallpike Right Symptoms Upbeat, right rotatory nystagmus;Right nystagmus  Dix-Hallpike Left  Dix-Hallpike Left Duration 5 seconds  Dix-Hallpike Left Symptoms Upbeat Nystagmus       04/10/21 0001  Assessment  Medical Diagnosis Dizziness  Referring Provider (PT) Natalie Morrow  Prior Therapy none  Precautions  Precautions Fall  Restrictions  Weight Bearing Restrictions No  Balance Screen  Has the patient fallen in the past 6 months No  Has the patient had a decrease in activity level because of a fear of falling?  Yes  Is the patient reluctant to leave their home because of a fear of falling?  No  Cognition  Overall Cognitive Status Within Functional Limits for tasks assessed  ROM / Strength  AROM / PROM / Strength AROM  AROM  AROM Assessment Site  Cervical  Cervical - Right Rotation 48  Cervical - Left Rotation 58  Palpation  Palpation comment mm spasms B upper trap.                Objective measurements completed on examination: See above findings.        Vestibular Treatment/Exercise - 04/11/21 0001       Vestibular Treatment/Exercise   Vestibular Treatment Provided Canalith Repositioning;Habituation;Gaze    Canalith Repositioning Epley Manuever Right    Gaze Exercises Eye/Head Exercise Horizontal;Eye/Head Exercise Vertical;X1 Viewing Horizontal;X1 Viewing Vertical       EPLEY MANUEVER RIGHT   Number of Reps  2    Overall Response Improved Symptoms      X1 Viewing  Horizontal   Reps 5    Comments slight dizziness      X1 Viewing Vertical   Reps 5    Comments mild to moderate dizziness      Eye/Head Exercise Horizontal   Reps 5    Comments slight dizziness      Eye/Head Exercise Vertical   Reps 5    Comments mild to moderate                   PT Education - 04/10/21 0930     Education Details What crystals in the ear means how we correct.  How weakness in eye mm can cause sx of dizziness    Person(s) Educated Patient;Spouse    Methods Explanation    Comprehension Verbalized understanding              PT Short Term Goals - 04/10/21 1525       PT SHORT TERM GOAL #1   Title Pt to be completing her HEP to state that her dizziness has decreased by at least 50%    Time 2    Period Weeks    Status New    Target Date 04/24/21      PT SHORT TERM GOAL #2   Title PT cervical ROM to have improved 5 degrees rotation in both directions to allow pt to visit with a person sitting next to her.    Time 2    Period Weeks    Status New               PT Long Term Goals - 04/10/21 1525       PT LONG TERM GOAL #1   Title Pt to be completing an advance HEP to state that pt dizziness has decreased to 75% to decrease risk of falling.    Time 4    Period Weeks    Status New    Target Date 05/08/21      PT LONG TERM GOAL #2   Title PT to be able to single leg stance for at least 10 seconds on both LE to decrease risk of falling    Time 4    Period Weeks                    Plan - 04/10/21 1525     Clinical Impression Statement Natalie Morrow is a 70 yo female who has been referred to skilled PT for complaints of dizziness. PT exam demonstrates + nystagmus with eye gaze exercises, saccdes and dixhal-pike manuever. Natalie Morrow also demonstrates decreased cervical motion with tight cervical mm. Natalie Morrow will benefit from skilled PT to address these deficits to decrease her symptoms of dizziness and decrease her risk of  falling.   Personal Factors and Comorbidities Age;Comorbidity 2;Time since onset of injury/illness/exacerbation    Comorbidities alzheimers, fibromylagia, back pain    Examination-Activity Limitations Bed Mobility;Locomotion Level    Examination-Participation Restrictions Community Activity;Driving;Shop;Cleaning    Stability/Clinical Decision Making Evolving/Moderate complexity    Rehab Potential Good    PT Frequency 2x / week    PT Duration 4 weeks    PT Treatment/Interventions Vestibular;Visual/perceptual remediation/compensation;Patient/family education;Manual techniques;Therapeutic exercise    PT Next Visit Plan Complete dixhale-pike manuever Epleys as needed, complete single leg stance test, give pt cervical excursion exercises to completed to improve mobility.    PT Home Exercise Plan eye gaze horizontal and vertical, smooth pursuits  horizontal and vertical             Patient will benefit from skilled therapeutic intervention in order to improve the following deficits and impairments:  Decreased balance, Decreased range of motion, Increased fascial restricitons, Increased muscle spasms, Dizziness, Postural dysfunction  Visit Diagnosis: BPPV (benign paroxysmal positional vertigo), bilateral     Problem List Patient Active Problem List   Diagnosis Date Noted   Cognitive dysfunction 11/04/2020   Screening for colorectal cancer 12/07/2019   Encounter for well woman exam with routine gynecological exam 12/07/2019   Family hx of colon cancer 02/17/2018   Rectocele 10/12/2017   Well female exam with routine gynecological exam 10/12/2017   Vaginal dryness 10/12/2017   AK (actinic keratosis) 09/17/2015   Virgina Organ, PT CLT [phone]  04/11/2021, 9:49 AM  [facility] [address] [place], [place], [address] Phone: [phone]   Fax:  [phone]  Name: Natalie Morrow MRN: [id number] Date of Birth: 1951-04-04

## 2021-04-14 ENCOUNTER — Ambulatory Visit (HOSPITAL_COMMUNITY): Payer: PPO | Attending: Neurology | Admitting: Physical Therapy

## 2021-04-14 ENCOUNTER — Encounter (HOSPITAL_COMMUNITY): Payer: Self-pay | Admitting: Physical Therapy

## 2021-04-14 ENCOUNTER — Other Ambulatory Visit: Payer: Self-pay

## 2021-04-14 DIAGNOSIS — H8113 Benign paroxysmal vertigo, bilateral: Secondary | ICD-10-CM | POA: Insufficient documentation

## 2021-04-14 NOTE — Therapy (Signed)
[facility] [address] [place], [place], [address] Phone: [phone]   Fax:  [phone]  Physical Therapy Treatment  Patient Details  Name: Natalie Morrow MRN: [id number] Date of Birth: 1951-05-17 Referring Provider (PT): Shon Millet   Encounter Date: 04/14/2021   PT End of Session - 04/14/21 1447     Visit Number 2    Number of Visits 8    Date for PT Re-Evaluation 05/10/21    Authorization Type MCR Advance    Progress Note Due on Visit 8    PT Start Time 1446    PT Stop Time 1525    PT Time Calculation (min) 39 min             Past Medical History:  Diagnosis Date   AK (actinic keratosis) 09/17/2015   Arthritis    Bulging lumbar disc    Bursitis of hip    Fibromyalgia    GERD (gastroesophageal reflux disease)    Hormone replacement therapy (HRT) 09/05/2013   Hypercholesteremia    Hypothyroidism    Plantar fasciitis, bilateral    Rosacea     Past Surgical History:  Procedure Laterality Date   BACK SURGERY  05/2015   CHOLECYSTECTOMY     COLONOSCOPY N/A 11/10/2012   Procedure: COLONOSCOPY;  Surgeon: Malissa Hippo, MD;  [facility];  Service: Endoscopy;  Laterality: N/A;  730   COLONOSCOPY WITH PROPOFOL N/A 04/08/2018   Procedure: COLONOSCOPY WITH PROPOFOL;  Surgeon: Malissa Hippo, MD;  [facility];  Service: Endoscopy;  Laterality: N/A;  100   TONSILLECTOMY      There were no vitals filed for this visit.   Subjective Assessment - 04/14/21 1451     Subjective States that she felt tired after last session and had to go home and lay down. Saturday she woke up and in the middle of breakfast she had some symptoms and resulted in some dry heaving. REports she is currently a little shaking and reports 5/10 dizziness/symptoms. Reports her exercises were difficulty because her arms physically fatigued.                [facility] PT Assessment - 04/14/21 0001       Assessment   Medical  Diagnosis Dizziness    Referring Provider (PT) Shon Millet                 Vestibular Assessment - 04/14/21 0001       Positional Testing   Dix-Hallpike Dix-Hallpike Right;Dix-Hallpike Left      Dix-Hallpike Right   Dix-Hallpike Right Symptoms No nystagmus   no symptoms     Dix-Hallpike Left   Dix-Hallpike Left Duration 10 seconds    Dix-Hallpike Left Symptoms Upbeat Nystagmus      Positional Sensitivities   Up from Right Hallpike Moderate dizziness   shaking   Up from Left Hallpike Severe dizziness                      [facility] Adult PT Treatment/Exercise - 04/14/21 0001       Exercises   Exercises Neck      Neck Exercises: Seated   Other Seated Exercise gaze stablization - seated with target on wall - 4x5 bilateral    Other Seated Exercise nasal breathing 5 minutes total                    PT Education - 04/14/21  [id number]     Education Details on anatomy, rationale for exercises, crystals and how that can cause symptoms.    Person(s) Educated Patient    Methods Explanation    Comprehension Verbalized understanding              PT Short Term Goals - 04/10/21 1525       PT SHORT TERM GOAL #1   Title Pt to be completing her HEP to state that her dizziness has decreased by at least 50%    Time 2    Period Weeks    Status New    Target Date 04/24/21      PT SHORT TERM GOAL #2   Title PT cervical ROM to have improved 5 degrees rotation in both directions to allow pt to visit with a person sitting next to her.    Time 2    Period Weeks    Status New               PT Long Term Goals - 04/10/21 1525       PT LONG TERM GOAL #1   Title Pt to be completing an advance HEP to state that pt dizziness has decreased to 75% to decrease risk of falling.    Time 4    Period Weeks    Status New    Target Date 05/08/21      PT LONG TERM GOAL #2   Title PT to be able to single leg stance for at least 10 seconds on both LE to decrease  risk of falling    Time 4    Period Weeks                   Plan - 04/14/21 1527     Clinical Impression Statement Improved response to Epley maneuver with no symptoms or nystagmus noted with right side but symptoms and nystagmus on left side. Less symptoms/reduced length of symptoms reported by patient. Changed exercise to stationary object on wall with gaze stabilization exercises as patient's arms fatigued with previous exercises. Too difficult for patient to perform at distance of 5 feet but able to perform from distance of 2 feet. Will continue to reassess need for Epley maneuver in future sessions.    Personal Factors and Comorbidities Age;Comorbidity 2;Time since onset of injury/illness/exacerbation    Comorbidities alzheimers, fibromylagia, back pain    Examination-Activity Limitations Bed Mobility;Locomotion Level    Examination-Participation Restrictions Community Activity;Driving;Shop;Cleaning    Stability/Clinical Decision Making Evolving/Moderate complexity    Rehab Potential Good    PT Frequency 2x / week    PT Duration 4 weeks    PT Treatment/Interventions Vestibular;Visual/perceptual remediation/compensation;Patient/family education;Manual techniques;Therapeutic exercise    PT Next Visit Plan Complete dixhale-pike manuever Epleys as needed, complete single leg stance test, give pt cervical excursion exercises to completed to improve mobility.    PT Home Exercise Plan eye gaze horizontal and vertical, smooth pursuits  horizontal and vertical             Patient will benefit from skilled therapeutic intervention in order to improve the following deficits and impairments:  Decreased balance, Decreased range of motion, Increased fascial restricitons, Increased muscle spasms, Dizziness, Postural dysfunction  Visit Diagnosis: BPPV (benign paroxysmal positional vertigo), bilateral     Problem List Patient Active Problem List   Diagnosis Date Noted   Cognitive  dysfunction 11/04/2020   Screening for colorectal cancer 12/07/2019   Encounter for well woman exam with routine  gynecological exam 12/07/2019   Family hx of colon cancer 02/17/2018   Rectocele 10/12/2017   Well female exam with routine gynecological exam 10/12/2017   Vaginal dryness 10/12/2017   AK (actinic keratosis) 09/17/2015    3:29 PM, 04/14/21 [name], DPT Physical Therapy with [facility]  [phone] office   [facility] [address] [place], [place], [address] Phone: [phone]   Fax:  [phone]  Name: KRYSTALYN KUBOTA MRN: [id number] Date of Birth: 1951/02/06

## 2021-04-15 ENCOUNTER — Telehealth: Payer: Self-pay | Admitting: Neurology

## 2021-04-15 DIAGNOSIS — F419 Anxiety disorder, unspecified: Secondary | ICD-10-CM

## 2021-04-15 DIAGNOSIS — F09 Unspecified mental disorder due to known physiological condition: Secondary | ICD-10-CM

## 2021-04-15 NOTE — Telephone Encounter (Signed)
Pt spouse called in and would like a call back regarding Eliora aricept. She is almost out of it, and want to discuss the next step. [phone]

## 2021-04-15 NOTE — Telephone Encounter (Signed)
Per last ov notes: 1.Start donepezil 5mg  at bedtime for a month.  If tolerating, will increase to 10mg  at bedtime.  Spoke to the pt husband, Pt wakes up after only five hours of sleep, and agitated. Pt C/O foot cramps. Per husband that the 10 mg should be okay.   Please advise.

## 2021-04-15 NOTE — Telephone Encounter (Signed)
Pt husband also wanted to know, As discussed during the last visit if the pt could start medication anxiety?

## 2021-04-17 ENCOUNTER — Other Ambulatory Visit: Payer: Self-pay

## 2021-04-17 ENCOUNTER — Ambulatory Visit (HOSPITAL_COMMUNITY): Payer: PPO | Admitting: Physical Therapy

## 2021-04-17 ENCOUNTER — Encounter (HOSPITAL_COMMUNITY): Payer: Self-pay | Admitting: Physical Therapy

## 2021-04-17 DIAGNOSIS — H8113 Benign paroxysmal vertigo, bilateral: Secondary | ICD-10-CM

## 2021-04-17 MED ORDER — DONEPEZIL HCL 10 MG PO TABS: 10.0000 mg | ORAL_TABLET | Freq: Every day | ORAL | 5 refills | Status: DC

## 2021-04-17 MED ORDER — ESCITALOPRAM OXALATE 5 MG PO TABS: 5.0000 mg | ORAL_TABLET | Freq: Every day | ORAL | 5 refills | Status: DC

## 2021-04-17 NOTE — Telephone Encounter (Signed)
Pt husband called in, said he hasnt heard back regarding the questions he had. He can be reached at [phone]

## 2021-04-17 NOTE — Telephone Encounter (Signed)
Pt husband advise of Dr.Jaffe note, Please send script for Aricept 10mg  at bedtime with 5 additional refills.  In addition, please send script for escitalopram 5mg  daily with 5 additional refills.  I would ask that she not start the escitalopram until she has been on the Aricept 10mg  at bedtime for one week.   Script sent .

## 2021-04-17 NOTE — Therapy (Signed)
[facility] [address] [place], [place], [address] Phone: [phone]   Fax:  [phone]  Physical Therapy Treatment  Patient Details  Name: Natalie Morrow MRN: [id number] Date of Birth: April 19, 1951 Referring Provider (PT): Shon Millet   Encounter Date: 04/17/2021   PT End of Session - 04/17/21 1440     Visit Number 3    Number of Visits 8    Date for PT Re-Evaluation 05/10/21    Authorization Type MCR Advance    Progress Note Due on Visit 8    PT Start Time 1442    PT Stop Time 1522    PT Time Calculation (min) 40 min             Past Medical History:  Diagnosis Date   AK (actinic keratosis) 09/17/2015   Arthritis    Bulging lumbar disc    Bursitis of hip    Fibromyalgia    GERD (gastroesophageal reflux disease)    Hormone replacement therapy (HRT) 09/05/2013   Hypercholesteremia    Hypothyroidism    Plantar fasciitis, bilateral    Rosacea     Past Surgical History:  Procedure Laterality Date   BACK SURGERY  05/2015   CHOLECYSTECTOMY     COLONOSCOPY N/A 11/10/2012   Procedure: COLONOSCOPY;  Surgeon: Malissa Hippo, MD;  [facility];  Service: Endoscopy;  Laterality: N/A;  730   COLONOSCOPY WITH PROPOFOL N/A 04/08/2018   Procedure: COLONOSCOPY WITH PROPOFOL;  Surgeon: Malissa Hippo, MD;  [facility];  Service: Endoscopy;  Laterality: N/A;  100   TONSILLECTOMY      There were no vitals filed for this visit.       [facility] PT Assessment - 04/17/21 0001       Assessment   Medical Diagnosis Dizziness    Referring Provider (PT) Shon Millet                 Vestibular Assessment - 04/17/21 0001       Dix-Hallpike Left   Dix-Hallpike Left Duration 10 seconds    Dix-Hallpike Left Symptoms Upbeat Nystagmus                      [facility] Adult PT Treatment/Exercise - 04/17/21 0001       Neck Exercises: Seated   Neck Retraction --   very difficult 8 minutes of  practice - tactile and verbal cues   Other Seated Exercise cervical ROM flex/ext/sb/rotation - 2 minute each - seated             Vestibular Treatment/Exercise - 04/17/21 0001       Vestibular Treatment/Exercise   Vestibular Treatment Provided Canalith Repositioning;Habituation;Gaze    Canalith Repositioning Epley Manuever Left       EPLEY MANUEVER RIGHT   Number of Reps  1                     PT Short Term Goals - 04/10/21 1525       PT SHORT TERM GOAL #1   Title Pt to be completing her HEP to state that her dizziness has decreased by at least 50%    Time 2    Period Weeks    Status New    Target Date 04/24/21      PT SHORT TERM GOAL #2   Title PT cervical ROM to have improved 5 degrees rotation in both directions to allow  pt to visit with a person sitting next to her.    Time 2    Period Weeks    Status New               PT Long Term Goals - 04/10/21 1525       PT LONG TERM GOAL #1   Title Pt to be completing an advance HEP to state that pt dizziness has decreased to 75% to decrease risk of falling.    Time 4    Period Weeks    Status New    Target Date 05/08/21      PT LONG TERM GOAL #2   Title PT to be able to single leg stance for at least 10 seconds on both LE to decrease risk of falling    Time 4    Period Weeks                   Plan - 04/17/21 1441     Clinical Impression Statement Performed Epley on left with nystagmus noted. Focused on neck ROM and exercises difficulty for patient to perform with good form. This improved with tactile and verbal cues. Chin tucks most difficult for patient. Fatigue in neck noted end of session, continued dizziness with no change in symptoms. Added neck ROM exercises to HEP.    Personal Factors and Comorbidities Age;Comorbidity 2;Time since onset of injury/illness/exacerbation    Comorbidities alzheimers, fibromylagia, back pain    Examination-Activity Limitations Bed Mobility;Locomotion  Level    Examination-Participation Restrictions Community Activity;Driving;Shop;Cleaning    Stability/Clinical Decision Making Evolving/Moderate complexity    Rehab Potential Good    PT Frequency 2x / week    PT Duration 4 weeks    PT Treatment/Interventions Vestibular;Visual/perceptual remediation/compensation;Patient/family education;Manual techniques;Therapeutic exercise    PT Next Visit Plan Complete dixhale-pike manuever Epleys as needed, complete single leg stance test,    PT Home Exercise Plan eye gaze horizontal and vertical, smooth pursuits  horizontal and vertical; 8/4 neck ROM exercises, chin tucks             Patient will benefit from skilled therapeutic intervention in order to improve the following deficits and impairments:  Decreased balance, Decreased range of motion, Increased fascial restricitons, Increased muscle spasms, Dizziness, Postural dysfunction  Visit Diagnosis: BPPV (benign paroxysmal positional vertigo), bilateral     Problem List Patient Active Problem List   Diagnosis Date Noted   Cognitive dysfunction 11/04/2020   Screening for colorectal cancer 12/07/2019   Encounter for well woman exam with routine gynecological exam 12/07/2019   Family hx of colon cancer 02/17/2018   Rectocele 10/12/2017   Well female exam with routine gynecological exam 10/12/2017   Vaginal dryness 10/12/2017   AK (actinic keratosis) 09/17/2015   3:29 PM, 04/17/21 Natalie Morrow, DPT Physical Therapy with [facility]  [phone] office   [facility] [address] [place], [place], [address] Phone: [phone]   Fax:  [phone]  Name: Natalie Morrow MRN: [id number] Date of Birth: [birth year]

## 2021-04-22 ENCOUNTER — Other Ambulatory Visit: Payer: Self-pay

## 2021-04-22 ENCOUNTER — Ambulatory Visit (HOSPITAL_COMMUNITY): Payer: PPO | Admitting: Physical Therapy

## 2021-04-22 DIAGNOSIS — H8113 Benign paroxysmal vertigo, bilateral: Secondary | ICD-10-CM

## 2021-04-22 NOTE — Therapy (Signed)
[facility] [address] [place], [place], [address] Phone: [phone]   Fax:  [phone]  Physical Therapy Treatment  Patient Details  Name: Natalie Morrow MRN: [id number] Date of Birth: 18-Jun-1951 Referring Provider (PT): Shon Millet   Encounter Date: 04/22/2021   PT End of Session - 04/22/21 1509     Visit Number 4    Number of Visits 8    Date for PT Re-Evaluation 05/10/21    Authorization Type MCR Advance    Progress Note Due on Visit 8    PT Start Time 1445    PT Stop Time 1523    PT Time Calculation (min) 38 min             Past Medical History:  Diagnosis Date   AK (actinic keratosis) 09/17/2015   Arthritis    Bulging lumbar disc    Bursitis of hip    Fibromyalgia    GERD (gastroesophageal reflux disease)    Hormone replacement therapy (HRT) 09/05/2013   Hypercholesteremia    Hypothyroidism    Plantar fasciitis, bilateral    Rosacea     Past Surgical History:  Procedure Laterality Date   BACK SURGERY  05/2015   CHOLECYSTECTOMY     COLONOSCOPY N/A 11/10/2012   Procedure: COLONOSCOPY;  Surgeon: Malissa Hippo, MD;  [facility];  Service: Endoscopy;  Laterality: N/A;  730   COLONOSCOPY WITH PROPOFOL N/A 04/08/2018   Procedure: COLONOSCOPY WITH PROPOFOL;  Surgeon: Malissa Hippo, MD;  [facility];  Service: Endoscopy;  Laterality: N/A;  100   TONSILLECTOMY      There were no vitals filed for this visit.   Subjective Assessment - 04/22/21 1450     Subjective States her dizziness is up and down. States that her MD increased her medicine and her body is still getting used to it. Reports she is having hot flashes and she is waking up in the night antsy. States overall her dizziness has gotten better. She has had less episodes and these are still transitional. States overall everything is better and her complaints are more to do with other symptoms.    Currently in Pain? No/denies   no  current symptoms               [facility] PT Assessment - 04/22/21 0001       Assessment   Medical Diagnosis Dizziness    Referring Provider (PT) Shon Millet                 Vestibular Assessment - 04/22/21 0001       Positional Testing   Dix-Hallpike Dix-Hallpike Right;Dix-Hallpike Left      Dix-Hallpike Right   Dix-Hallpike Right Symptoms No nystagmus   no dizziness     Dix-Hallpike Left   Dix-Hallpike Left Symptoms No nystagmus   negative, no symptoms     Positional Sensitivities   Up from Right Hallpike Severe dizziness    Up from Left Hallpike Moderate dizziness                      [facility] Adult PT Treatment/Exercise - 04/22/21 0001       Neck Exercises: Seated   Other Seated Exercise sit to stand - total of 10 minutes - rests when symptoms presented - max of 10 reps (started at 2)    Other Seated Exercise tall posture cervical ROM flexion/extension/SB/ROT 2x15 each  Vestibular Treatment/Exercise - 04/22/21 0001       Vestibular Treatment/Exercise   Vestibular Treatment Provided Canalith Repositioning;Habituation;Gaze                   PT Education - 04/22/21 1519     Education Details on habituation exercises, on rationale for exercises, on how neck and posture can cause some symptoms.    Person(s) Educated Patient    Methods Explanation    Comprehension Verbalized understanding              PT Short Term Goals - 04/10/21 1525       PT SHORT TERM GOAL #1   Title Pt to be completing her HEP to state that her dizziness has decreased by at least 50%    Time 2    Period Weeks    Status New    Target Date 04/24/21      PT SHORT TERM GOAL #2   Title PT cervical ROM to have improved 5 degrees rotation in both directions to allow pt to visit with a person sitting next to her.    Time 2    Period Weeks    Status New               PT Long Term Goals - 04/10/21 1525       PT LONG TERM GOAL #1    Title Pt to be completing an advance HEP to state that pt dizziness has decreased to 75% to decrease risk of falling.    Time 4    Period Weeks    Status New    Target Date 05/08/21      PT LONG TERM GOAL #2   Title PT to be able to single leg stance for at least 10 seconds on both LE to decrease risk of falling    Time 4    Period Weeks                   Plan - 04/22/21 1517     Clinical Impression Statement Negative Dix hallpike bilaterally. Continues to have dizziness with transitional movements. Educated patient on habituation and added sit to stand to HEP. Initially patient had onset of symptoms at 3rd rep. Able to increase to 10 reps by end of session before symptoms presented. Fatigue in legs noted end of session. Improved cervical motion noted end of session. Will continue to progress habituation exercises as tolerated.    Personal Factors and Comorbidities Age;Comorbidity 2;Time since onset of injury/illness/exacerbation    Comorbidities alzheimers, fibromylagia, back pain    Examination-Activity Limitations Bed Mobility;Locomotion Level    Examination-Participation Restrictions Community Activity;Driving;Shop;Cleaning    Stability/Clinical Decision Making Evolving/Moderate complexity    Rehab Potential Good    PT Frequency 2x / week    PT Duration 4 weeks    PT Treatment/Interventions Vestibular;Visual/perceptual remediation/compensation;Patient/family education;Manual techniques;Therapeutic exercise    PT Next Visit Plan habituation exercises. complete single leg stance test,    PT Home Exercise Plan eye gaze horizontal and vertical, smooth pursuits  horizontal and vertical; 8/4 neck ROM exercises, chin tucks             Patient will benefit from skilled therapeutic intervention in order to improve the following deficits and impairments:  Decreased balance, Decreased range of motion, Increased fascial restricitons, Increased muscle spasms, Dizziness, Postural  dysfunction  Visit Diagnosis: BPPV (benign paroxysmal positional vertigo), bilateral     Problem List Patient Active Problem List  Diagnosis Date Noted   Cognitive dysfunction 11/04/2020   Screening for colorectal cancer 12/07/2019   Encounter for well woman exam with routine gynecological exam 12/07/2019   Family hx of colon cancer 02/17/2018   Rectocele 10/12/2017   Well female exam with routine gynecological exam 10/12/2017   Vaginal dryness 10/12/2017   AK (actinic keratosis) 09/17/2015   3:27 PM, 04/22/21 [name], DPT Physical Therapy with [facility]  [phone] office   [facility] [address] [place], [place], [address] Phone: [phone]   Fax:  [phone]  Name: CLARE FENNIMORE MRN: [id number] Date of Birth: 03/30/1951

## 2021-04-24 ENCOUNTER — Other Ambulatory Visit: Payer: Self-pay

## 2021-04-24 ENCOUNTER — Ambulatory Visit (HOSPITAL_COMMUNITY): Payer: PPO | Admitting: Physical Therapy

## 2021-04-24 DIAGNOSIS — H8113 Benign paroxysmal vertigo, bilateral: Secondary | ICD-10-CM | POA: Diagnosis not present

## 2021-04-24 NOTE — Therapy (Signed)
[facility] [address] [place], [place], [address] Phone: [phone]   Fax:  [phone]  Physical Therapy Treatment  Patient Details  Name: Natalie Morrow MRN: [id number] Date of Birth: Oct 23, 1950 Referring Provider (PT): Shon Millet   Encounter Date: 04/24/2021   PT End of Session - 04/24/21 1446     Visit Number 5    Number of Visits 8    Date for PT Re-Evaluation 05/10/21    Authorization Type MCR Advance    Progress Note Due on Visit 8    PT Start Time 1400    PT Stop Time 1444    PT Time Calculation (min) 44 min    Activity Tolerance Patient tolerated treatment well    Behavior During Therapy [facility] for tasks assessed/performed             Past Medical History:  Diagnosis Date   AK (actinic keratosis) 09/17/2015   Arthritis    Bulging lumbar disc    Bursitis of hip    Fibromyalgia    GERD (gastroesophageal reflux disease)    Hormone replacement therapy (HRT) 09/05/2013   Hypercholesteremia    Hypothyroidism    Plantar fasciitis, bilateral    Rosacea     Past Surgical History:  Procedure Laterality Date   BACK SURGERY  05/2015   CHOLECYSTECTOMY     COLONOSCOPY N/A 11/10/2012   Procedure: COLONOSCOPY;  Surgeon: Malissa Hippo, MD;  [facility];  Service: Endoscopy;  Laterality: N/A;  730   COLONOSCOPY WITH PROPOFOL N/A 04/08/2018   Procedure: COLONOSCOPY WITH PROPOFOL;  Surgeon: Malissa Hippo, MD;  [facility];  Service: Endoscopy;  Laterality: N/A;  100   TONSILLECTOMY      There were no vitals filed for this visit.   Subjective Assessment - 04/24/21 1401     Subjective Pt states that her dizziness is getting better.  She state that she has not been dizzy for a couple of days.  She is still doing her eye exercises.    Currently in Pain? No/denies                [facility] PT Assessment - 04/24/21 0001       Functional Tests   Functional tests Single leg stance       Single Leg Stance   Comments Rt :  0 seconds LT 4                 Vestibular Assessment - 04/24/21 0001       Oculomotor Exam   Smooth Pursuits --   Rt normal, Lt slight decreased trajetory     Positional Testing   Dix-Hallpike Dix-Hallpike Right;Dix-Hallpike Left      Dix-Hallpike Right   Dix-Hallpike Right Symptoms No nystagmus   no dizziness     Dix-Hallpike Left   Dix-Hallpike Left Symptoms No nystagmus   negative, no symptoms                     [facility] Adult PT Treatment/Exercise - 04/24/21 0001       Neck Exercises: Supine   Cervical Isometrics Extension;Right lateral flexion;Left lateral flexion;5 secs    Neck Retraction 10 reps    Neck Retraction Limitations scapular retraction x 10      Manual Therapy   Manual Therapy Soft tissue mobilization    Manual therapy comments completed seperate from all other aspects of treatment    Soft  tissue mobilization to improve ROM of cervical area down in supine with passive stretching.             Vestibular Treatment/Exercise - 04/24/21 0001       Vestibular Treatment/Exercise   Habituation Exercises Gari Crown Daroff   Number of Reps  5      X1 Viewing Horizontal   Reps 5      X1 Viewing Vertical   Reps 5                Balance Exercises - 04/24/21 0001       Balance Exercises: Standing   SLS Eyes open;3 reps    Sit to Stand Standard surface               PT Education - 04/24/21 1446     Education Details updated HEP    Person(s) Educated Patient    Methods Explanation    Comprehension Verbalized understanding              PT Short Term Goals - 04/24/21 1450       PT SHORT TERM GOAL #1   Title Pt to be completing her HEP to state that her dizziness has decreased by at least 50%    Time 2    Period Weeks    Status Achieved    Target Date 04/24/21      PT SHORT TERM GOAL #2   Title PT cervical ROM to have improved 5 degrees rotation in both  directions to allow pt to visit with a person sitting next to her.    Time 2    Period Weeks    Status Achieved               PT Long Term Goals - 04/24/21 1450       PT LONG TERM GOAL #1   Title Pt to be completing an advance HEP to state that pt dizziness has decreased to 75% to decrease risk of falling.    Time 4    Period Weeks    Status On-going      PT LONG TERM GOAL #2   Title PT to be able to single leg stance for at least 10 seconds on both LE to decrease risk of falling    Time 4    Period Weeks    Status On-going                   Plan - 04/24/21 1446     Clinical Impression Statement Pt Dix hallpke continues to be negative, no need to test next session.  Added Bradt Daroff execise to work on dizziness coming from side lying to sit, sit to stand and single leg stance.  Overall pt feels much improved.  Manual began on cervical area to improve ROM.    Personal Factors and Comorbidities Age;Comorbidity 2;Time since onset of injury/illness/exacerbation    Comorbidities alzheimers, fibromylagia, back pain    Examination-Activity Limitations Bed Mobility;Locomotion Level    Examination-Participation Restrictions Community Activity;Driving;Shop;Cleaning    Stability/Clinical Decision Making Evolving/Moderate complexity    Rehab Potential Good    PT Frequency 2x / week    PT Duration 4 weeks    PT Treatment/Interventions Vestibular;Visual/perceptual remediation/compensation;Patient/family education;Manual techniques;Therapeutic exercise    PT Next Visit Plan habituation exercises. complete single leg stance test,    PT Home Exercise Plan eye gaze horizontal and vertical, smooth pursuits  horizontal and  vertical; 8/4 neck ROM exercises, chin tucks; 8/11: Austin Miles, sit to stand and single leg stance             Patient will benefit from skilled therapeutic intervention in order to improve the following deficits and impairments:  Decreased balance,  Decreased range of motion, Increased fascial restricitons, Increased muscle spasms, Dizziness, Postural dysfunction  Visit Diagnosis: BPPV (benign paroxysmal positional vertigo), bilateral     Problem List Patient Active Problem List   Diagnosis Date Noted   Cognitive dysfunction 11/04/2020   Screening for colorectal cancer 12/07/2019   Encounter for well woman exam with routine gynecological exam 12/07/2019   Family hx of colon cancer 02/17/2018   Rectocele 10/12/2017   Well female exam with routine gynecological exam 10/12/2017   Vaginal dryness 10/12/2017   AK (actinic keratosis) 09/17/2015  Virgina Organ, PT CLT [phone]  04/24/2021, 2:51 PM  [facility] [address] [place], [place], [address] Phone: [phone]   Fax:  [phone]  Name: Natalie Morrow MRN: [id number] Date of Birth: December 27, 1950

## 2021-04-28 ENCOUNTER — Other Ambulatory Visit: Payer: Self-pay

## 2021-04-28 ENCOUNTER — Ambulatory Visit (HOSPITAL_COMMUNITY): Payer: PPO | Admitting: Physical Therapy

## 2021-04-28 DIAGNOSIS — H8113 Benign paroxysmal vertigo, bilateral: Secondary | ICD-10-CM | POA: Diagnosis not present

## 2021-04-28 NOTE — Therapy (Signed)
[facility] [address] [place], [place], [address] Phone: [phone]   Fax:  [phone]  Physical Therapy Treatment  Patient Details  Name: Natalie Morrow MRN: [id number] Date of Birth: 03-Sep-1951 Referring Provider (PT): Shon Millet   Encounter Date: 04/28/2021   PT End of Session - 04/28/21 1406     Visit Number 6    Number of Visits 8    Date for PT Re-Evaluation 05/10/21    Authorization Type MCR Advance    Progress Note Due on Visit 8    PT Start Time 1405    PT Stop Time 1445    PT Time Calculation (min) 40 min             Past Medical History:  Diagnosis Date   AK (actinic keratosis) 09/17/2015   Arthritis    Bulging lumbar disc    Bursitis of hip    Fibromyalgia    GERD (gastroesophageal reflux disease)    Hormone replacement therapy (HRT) 09/05/2013   Hypercholesteremia    Hypothyroidism    Plantar fasciitis, bilateral    Rosacea     Past Surgical History:  Procedure Laterality Date   BACK SURGERY  05/2015   CHOLECYSTECTOMY     COLONOSCOPY N/A 11/10/2012   Procedure: COLONOSCOPY;  Surgeon: Malissa Hippo, MD;  [facility];  Service: Endoscopy;  Laterality: N/A;  730   COLONOSCOPY WITH PROPOFOL N/A 04/08/2018   Procedure: COLONOSCOPY WITH PROPOFOL;  Surgeon: Malissa Hippo, MD;  [facility];  Service: Endoscopy;  Laterality: N/A;  100   TONSILLECTOMY      There were no vitals filed for this visit.   Subjective Assessment - 04/28/21 1407     Subjective Pt states that she has not been dizzy all weekend    Currently in Pain? No/denies                         Balance Exercises - 04/28/21 0001       Balance Exercises: Standing   Standing Eyes Opened Narrow base of support (BOS);Foam/compliant surface;5 reps   head turns   Tandem Stance Foam/compliant surface;Eyes open;5 reps   head turns   Rockerboard Anterior/posterior;Lateral;10 reps    Tandem Gait  Forward;2 reps    Retro Gait 2 reps    Marching 10 reps    Sit to Stand Standard surface             Manual completed separate of all others to improve ROM and decrease spasms of  Cervical spine.    PT Short Term Goals - 04/24/21 1450       PT SHORT TERM GOAL #1   Title Pt to be completing her HEP to state that her dizziness has decreased by at least 50%    Time 2    Period Weeks    Status Achieved    Target Date 04/24/21      PT SHORT TERM GOAL #2   Title PT cervical ROM to have improved 5 degrees rotation in both directions to allow pt to visit with a person sitting next to her.    Time 2    Period Weeks    Status Achieved               PT Long Term Goals - 04/24/21 1450       PT LONG TERM GOAL #1   Title Pt to  be completing an advance HEP to state that pt dizziness has decreased to 75% to decrease risk of falling.    Time 4    Period Weeks    Status On-going      PT LONG TERM GOAL #2   Title PT to be able to single leg stance for at least 10 seconds on both LE to decrease risk of falling    Time 4    Period Weeks    Status On-going                   Plan - 04/28/21 1430     Clinical Impression Statement Pt no longer having dizziness but has some balance issue.  Pt also continues to lack in cervical ROM.  Pt will continue to benefit from skilled therapy to address these issues and maximize her functional ability.    Comorbidities alzheimers, fibromylagia, back pain    Examination-Activity Limitations Bed Mobility;Locomotion Level    Examination-Participation Restrictions Community Activity;Driving;Shop;Cleaning    Stability/Clinical Decision Making Evolving/Moderate complexity    Clinical Decision Making Moderate    Rehab Potential Good    PT Frequency 2x / week    PT Duration 4 weeks    PT Treatment/Interventions Vestibular;Visual/perceptual remediation/compensation;Patient/family education;Manual techniques;Therapeutic exercise    PT  Next Visit Plan continue with balance and ROM    PT Home Exercise Plan eye gaze horizontal and vertical, smooth pursuits  horizontal and vertical; 8/4 neck ROM exercises, chin tucks; 8/11: Austin Miles, sit to stand and single leg stance             Patient will benefit from skilled therapeutic intervention in order to improve the following deficits and impairments:  Decreased balance, Decreased range of motion, Increased fascial restricitons, Increased muscle spasms, Dizziness, Postural dysfunction  Visit Diagnosis: BPPV (benign paroxysmal positional vertigo), bilateral     Problem List Patient Active Problem List   Diagnosis Date Noted   Cognitive dysfunction 11/04/2020   Screening for colorectal cancer 12/07/2019   Encounter for well woman exam with routine gynecological exam 12/07/2019   Family hx of colon cancer 02/17/2018   Rectocele 10/12/2017   Well female exam with routine gynecological exam 10/12/2017   Vaginal dryness 10/12/2017   AK (actinic keratosis) 09/17/2015  Virgina Organ, PT CLT [phone]  04/28/2021, 2:46 PM  [facility] [address] [place], [place], [address] Phone: [phone]   Fax:  [phone]  Name: Natalie Morrow MRN: [id number] Date of Birth: Dec 19, 1950

## 2021-04-30 ENCOUNTER — Ambulatory Visit (HOSPITAL_COMMUNITY): Payer: PPO | Admitting: Physical Therapy

## 2021-04-30 ENCOUNTER — Other Ambulatory Visit: Payer: Self-pay

## 2021-04-30 ENCOUNTER — Encounter (HOSPITAL_COMMUNITY): Payer: Self-pay | Admitting: Physical Therapy

## 2021-04-30 DIAGNOSIS — H8113 Benign paroxysmal vertigo, bilateral: Secondary | ICD-10-CM | POA: Diagnosis not present

## 2021-04-30 NOTE — Therapy (Signed)
[facility] [address] [place], [place], [address] Phone: [phone]   Fax:  [phone]  Physical Therapy Treatment  Patient Details  Name: Natalie Morrow MRN: [id number] Date of Birth: Dec 19, 1950 Referring Provider (PT): Shon Millet   Encounter Date: 04/30/2021   PT End of Session - 04/30/21 1315     Visit Number 7    Number of Visits 8    Date for PT Re-Evaluation 05/10/21    Authorization Type MCR Advance    Progress Note Due on Visit 8    PT Start Time 1320    PT Stop Time 1400    PT Time Calculation (min) 40 min    Equipment Utilized During Treatment Gait belt             Past Medical History:  Diagnosis Date   AK (actinic keratosis) 09/17/2015   Arthritis    Bulging lumbar disc    Bursitis of hip    Fibromyalgia    GERD (gastroesophageal reflux disease)    Hormone replacement therapy (HRT) 09/05/2013   Hypercholesteremia    Hypothyroidism    Plantar fasciitis, bilateral    Rosacea     Past Surgical History:  Procedure Laterality Date   BACK SURGERY  05/2015   CHOLECYSTECTOMY     COLONOSCOPY N/A 11/10/2012   Procedure: COLONOSCOPY;  Surgeon: Malissa Hippo, MD;  [facility];  Service: Endoscopy;  Laterality: N/A;  730   COLONOSCOPY WITH PROPOFOL N/A 04/08/2018   Procedure: COLONOSCOPY WITH PROPOFOL;  Surgeon: Malissa Hippo, MD;  [facility];  Service: Endoscopy;  Laterality: N/A;  100   TONSILLECTOMY      There were no vitals filed for this visit.   Subjective Assessment - 04/30/21 1314     Subjective Pt states that she is completing her exercises, no dizziness    Currently in Pain? No/denies                               [facility] Adult PT Treatment/Exercise - 04/30/21 0001       Neck Exercises: Seated   Lateral Flexion Both;5 reps      Manual Therapy   Manual Therapy Soft tissue mobilization    Manual therapy comments completed seperate from all  other aspects of treatment    Soft tissue mobilization to improve ROMdecrease spasms of cervical area down in supine with passive stretching.                 Balance Exercises - 04/30/21 0001       Balance Exercises: Standing   Tandem Gait Forward;Foam/compliant surface;2 reps    Retro Gait 2 reps;Foam/compliant surface    Sidestepping 2 reps;Foam/compliant support   with cognitive challenges   Marching 10 reps    Sit to Stand Foam/compliant surface    Other Standing Exercises Comments walking on foam with mini sqats x 1 RT                 PT Short Term Goals - 04/24/21 1450       PT SHORT TERM GOAL #1   Title Pt to be completing her HEP to state that her dizziness has decreased by at least 50%    Time 2    Period Weeks    Status Achieved    Target Date 04/24/21      PT SHORT TERM GOAL #  2   Title PT cervical ROM to have improved 5 degrees rotation in both directions to allow pt to visit with a person sitting next to her.    Time 2    Period Weeks    Status Achieved               PT Long Term Goals - 04/24/21 1450       PT LONG TERM GOAL #1   Title Pt to be completing an advance HEP to state that pt dizziness has decreased to 75% to decrease risk of falling.    Time 4    Period Weeks    Status On-going      PT LONG TERM GOAL #2   Title PT to be able to single leg stance for at least 10 seconds on both LE to decrease risk of falling    Time 4    Period Weeks    Status On-going                   Plan - 04/30/21 1316     Clinical Impression Statement Continues to progress balance activity as well as cervical motion.   Pt has progressed well and should be ready for discharge next treatment.    Comorbidities alzheimers, fibromylagia, back pain    Examination-Activity Limitations Bed Mobility;Locomotion Level    Examination-Participation Restrictions Community Activity;Driving;Shop;Cleaning    Stability/Clinical Decision Making  Evolving/Moderate complexity    Rehab Potential Good    PT Frequency 2x / week    PT Duration 4 weeks    PT Treatment/Interventions Vestibular;Visual/perceptual remediation/compensation;Patient/family education;Manual techniques;Therapeutic exercise    PT Next Visit Plan Possible discharge.    PT Home Exercise Plan eye gaze horizontal and vertical, smooth pursuits  horizontal and vertical; 8/4 neck ROM exercises, chin tucks; 8/11: Austin Miles, sit to stand and single leg stance             Patient will benefit from skilled therapeutic intervention in order to improve the following deficits and impairments:  Decreased balance, Decreased range of motion, Increased fascial restricitons, Increased muscle spasms, Dizziness, Postural dysfunction  Visit Diagnosis: BPPV (benign paroxysmal positional vertigo), bilateral     Problem List Patient Active Problem List   Diagnosis Date Noted   Cognitive dysfunction 11/04/2020   Screening for colorectal cancer 12/07/2019   Encounter for well woman exam with routine gynecological exam 12/07/2019   Family hx of colon cancer 02/17/2018   Rectocele 10/12/2017   Well female exam with routine gynecological exam 10/12/2017   Vaginal dryness 10/12/2017   AK (actinic keratosis) 09/17/2015    Virgina Organ, PT CLT [phone]  04/30/2021, 2:02 PM  [facility] [address] [place], [place], [address] Phone: [phone]   Fax:  [phone]  Name: Natalie Morrow MRN: [id number] Date of Birth: 10/09/1950

## 2021-05-06 ENCOUNTER — Other Ambulatory Visit: Payer: Self-pay

## 2021-05-06 ENCOUNTER — Ambulatory Visit (HOSPITAL_COMMUNITY): Payer: PPO | Admitting: Physical Therapy

## 2021-05-06 DIAGNOSIS — H8113 Benign paroxysmal vertigo, bilateral: Secondary | ICD-10-CM

## 2021-05-06 NOTE — Therapy (Signed)
[place], [place], [address] Phone: [phone]   Fax:  [phone]  Physical Therapy Treatment  Patient Details  Name: Natalie Morrow MRN: [id number] Date of Birth: [birth year] Referring Provider (PT): Metta Clines  PHYSICAL THERAPY DISCHARGE SUMMARY  Visits from Start of Care: 8  Current functional level related to goals / functional outcomes: No dizziness   Remaining deficits: Decreased balance   Education / Equipment: HEP   Patient agrees to discharge. Patient goals were met. Patient is being discharged due to meeting the stated rehab goals.  Encounter Date: 05/06/2021   PT End of Session - 05/06/21 1309     Visit Number 8    Number of Visits 8    Date for PT Re-Evaluation 05/10/21    Authorization Type MCR Advance    Progress Note Due on Visit 8    PT Start Time 1315    PT Stop Time 4944    PT Time Calculation (min) 38 min    Equipment Utilized During Treatment Gait belt             Past Medical History:  Diagnosis Date   AK (actinic keratosis) 09/17/2015   Arthritis    Bulging lumbar disc    Bursitis of hip    Fibromyalgia    GERD (gastroesophageal reflux disease)    Hormone replacement therapy (HRT) 09/05/2013   Hypercholesteremia    Hypothyroidism    Plantar fasciitis, bilateral    Rosacea     Past Surgical History:  Procedure Laterality Date   BACK SURGERY  05/2015   CHOLECYSTECTOMY     COLONOSCOPY N/A 11/10/2012   Procedure: COLONOSCOPY;  Surgeon: Rogene Houston, MD;  [facility];  Service: Endoscopy;  Laterality: N/A;  730   COLONOSCOPY WITH PROPOFOL N/A 04/08/2018   Procedure: COLONOSCOPY WITH PROPOFOL;  Surgeon: Rogene Houston, MD;  Location: [facility];  Service: Endoscopy;  Laterality: N/A;  100   TONSILLECTOMY      There were no vitals filed for this visit.   Subjective Assessment - 05/06/21 1309     Subjective Pt states that she is completing her  exercises, no dizziness    Currently in Pain? Yes                [facility] PT Assessment - 05/06/21 0001       Assessment   Medical Diagnosis Dizziness    Referring Provider (PT) Metta Clines    Prior Therapy none      Precautions   Precautions Fall      Restrictions   Weight Bearing Restrictions No      Cognition   Overall Cognitive Status Within Functional Limits for tasks assessed      Functional Tests   Functional tests Single leg stance      Single Leg Stance   Comments Rt :  10 seconds LT 34   was Rt 0; Lt 4     AROM   Cervical - Right Rotation 58   was 48   Cervical - Left Rotation 65   was 58                Vestibular Assessment - 05/06/21 0001       Symptom Behavior   Subjective history of current problem Pt states that the other night she rolled over and felt a  little dizzy but that is the first time in over a week and  she feels that may be sinus (P)       Positional Testing   Dix-Hallpike Dix-Hallpike Right;Dix-Hallpike Left (P)       Dix-Hallpike Right   Dix-Hallpike Right Symptoms No nystagmus (P)    no dizziness     Dix-Hallpike Left   Dix-Hallpike Left Symptoms No nystagmus (P)    negative, no symptoms                     [facility] Adult PT Treatment/Exercise - 05/06/21 0001       Exercises   Exercises Neck      Neck Exercises: Seated   Neck Retraction 10 reps   x2                   PT Education - 05/06/21 1352     Education Details Epleys manuever sheet,( so pt may attempt self manuevering if BPPV comes back.    Person(s) Educated Patient;Spouse    Methods Explanation;Demonstration;Handout    Comprehension Verbalized understanding              PT Short Term Goals - 05/06/21 1311       PT SHORT TERM GOAL #1   Title Pt to be completing her HEP to state that her dizziness has decreased by at least 50%    Time 2    Period Weeks    Status Achieved    Target Date 04/24/21      PT SHORT TERM GOAL #2    Title PT cervical ROM to have improved 5 degrees rotation in both directions to allow pt to visit with a person sitting next to her.    Time 2    Period Weeks    Status Achieved               PT Long Term Goals - 05/06/21 1311       PT LONG TERM GOAL #1   Title Pt to be completing an advance HEP to state that pt dizziness has decreased to 75% to decrease risk of falling.    Time 4    Period Weeks    Status On-going      PT LONG TERM GOAL #2   Title PT to be able to single leg stance for at least 10 seconds on both LE to decrease risk of falling    Time 4    Period Weeks    Status Achieved                   Plan - 05/06/21 1310     Clinical Impression Statement Pt reassessed today she has had no dizziness in over a week which is her current referal diagnosis.  Pt is no longer in need of skilled PT and can be discharged.    Personal Factors and Comorbidities Age;Comorbidity 2;Time since onset of injury/illness/exacerbation    Comorbidities alzheimers, fibromylagia, back pain    Examination-Activity Limitations Bed Mobility;Locomotion Level    Examination-Participation Restrictions Community Activity;Driving;Shop;Cleaning    Stability/Clinical Decision Making Evolving/Moderate complexity    Clinical Decision Making Moderate    Rehab Potential Good    PT Frequency 2x / week    PT Duration 4 weeks    PT Treatment/Interventions Vestibular;Visual/perceptual remediation/compensation;Patient/family education;Manual techniques;Therapeutic exercise    PT Next Visit Plan Discharge             Patient will benefit from skilled therapeutic intervention in order to improve the following deficits and  impairments:  Decreased balance, Decreased range of motion, Increased fascial restricitons, Increased muscle spasms, Dizziness, Postural dysfunction  Visit Diagnosis: BPPV (benign paroxysmal positional vertigo), bilateral     Problem List Patient Active Problem List    Diagnosis Date Noted   Cognitive dysfunction 11/04/2020   Screening for colorectal cancer 12/07/2019   Encounter for well woman exam with routine gynecological exam 12/07/2019   Family hx of colon cancer 02/17/2018   Rectocele 10/12/2017   Well female exam with routine gynecological exam 10/12/2017   Vaginal dryness 10/12/2017   AK (actinic keratosis) 09/17/2015   Rayetta Humphrey, PT CLT [phone]  05/06/2021, 1:55 PM  [place] [address] [place], [place], [address] Phone: [phone]   Fax:  [phone]  Name: Natalie Morrow MRN: [id number] Date of Birth: Jun 11, 1951

## 2021-05-08 ENCOUNTER — Encounter (HOSPITAL_COMMUNITY): Payer: PPO | Admitting: Physical Therapy

## 2021-05-15 ENCOUNTER — Encounter (HOSPITAL_COMMUNITY): Payer: PPO | Admitting: Physical Therapy

## 2021-06-13 DIAGNOSIS — Z0001 Encounter for general adult medical examination with abnormal findings: Secondary | ICD-10-CM | POA: Diagnosis not present

## 2021-06-13 DIAGNOSIS — N183 Chronic kidney disease, stage 3 unspecified: Secondary | ICD-10-CM | POA: Diagnosis not present

## 2021-06-13 DIAGNOSIS — E039 Hypothyroidism, unspecified: Secondary | ICD-10-CM | POA: Diagnosis not present

## 2021-06-13 DIAGNOSIS — M797 Fibromyalgia: Secondary | ICD-10-CM | POA: Diagnosis not present

## 2021-06-13 DIAGNOSIS — F419 Anxiety disorder, unspecified: Secondary | ICD-10-CM | POA: Diagnosis not present

## 2021-06-13 DIAGNOSIS — E7849 Other hyperlipidemia: Secondary | ICD-10-CM | POA: Diagnosis not present

## 2021-06-13 DIAGNOSIS — R002 Palpitations: Secondary | ICD-10-CM | POA: Diagnosis not present

## 2021-06-13 DIAGNOSIS — D509 Iron deficiency anemia, unspecified: Secondary | ICD-10-CM | POA: Diagnosis not present

## 2021-06-25 DIAGNOSIS — Z23 Encounter for immunization: Secondary | ICD-10-CM | POA: Diagnosis not present

## 2021-07-25 ENCOUNTER — Ambulatory Visit: Payer: PPO | Admitting: Neurology

## 2021-07-27 NOTE — Progress Notes (Signed)
NEUROLOGY FOLLOW UP OFFICE NOTE  ANJULI GEMMILL [id number]  Assessment/Plan:   Mild neurocognitive disorder due to Alzheimer's disease Anxiety  To see if symptoms are side effect of donepezil, will have her discontinue donepezil.  Contact me in 2 weeks with update. While we are trying to sort out if symptoms are pharmacologic side effects, would not increase escitalopram for now.  But plan would be to eventually increase to 10mg  daily. Follow up with as scheduled.  Subjective:  [name]. Rather is a 69 year old left-handed female with hypothyroidism who follows up for Alzheimer's and BPPV.  She is accompanied by her son.   UPDATE: Current medications:  donepezil 10mg  at bedtime, escitalopram 5mg  daily  She was referred to vestibular rehab over the summer for her BPPV, which was effective.   Started donepezil last visit.  She has had some mild improvement.  She is recognizing her husband now.  She also started escitalopram for anxiety.  For a while, she has been feeling weak and shaky.  She reports both hot flashes and cold flashes.  She endorses nightmares and legs sometimes ache.  This started after initiating donepezil but before starting escitalopram.  Anxiety has not improved.     HISTORY: On the evening of 08/04/2020, she did not recognize her husband.  She wasn't agitated but didn't want to sleep in the same bed and asked him to leave the house.  The next morning, she still didn't recognize her husband.  Her son came over and she did recognize her son.  They brought her to the ED.  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  CMP showed normal kidney and liver function with no electrolyte abnormalities.  TSH was 1.687.  WBC on CBC was normal.  UA was positive for UTI.  She was discharged on antibiotics.  B12, folate and RPR reportedly normal.  Over the next  several weeks, symptoms slowly improved but have not completely resolved.  She will still sometimes briefly not recognize her husband.  On one occasion, she did not really recognize her son either.  She has still been able to perform all ADLs however she has not been driving since this started.  No headache, fever, cough.  Prior to onset, she had been experiencing dizziness/vertigo.  Also, she appeared to not be as talkative on the phone.  She has been under increased stress caring for her husband who has been having worsening kidney disease and may need to start dialysis.  EEG on 09/25/2020 showed mild left temporal slowing.  She underwent neuropsychological testing on 10/28/2020 showed mild cognitive deficits in memory and executive function but did not meet criteria for dementia.  PET scan of brain on 03/10/2021 showed decreased relative cortical metabolism within the left parietal lobe as may be seen in Alzheimer's type pathology.   Mother and maternal aunt dementia. Her sister has been experiencing symptoms as well.  PAST MEDICAL HISTORY: Past Medical History:  Diagnosis Date   AK (actinic keratosis) 09/17/2015   Arthritis    Bulging lumbar disc    Bursitis of hip    Fibromyalgia    GERD (gastroesophageal reflux disease)    Hormone replacement therapy (HRT) 09/05/2013   Hypercholesteremia    Hypothyroidism    Plantar fasciitis, bilateral    Rosacea     MEDICATIONS: Current Outpatient Medications on File Prior to Visit  Medication  Sig Dispense Refill   acetaminophen (TYLENOL) 500 MG tablet Take 500 mg by mouth as needed for mild pain.      cholecalciferol (VITAMIN D3) 25 MCG (1000 UNIT) tablet Take 1,000 Units by mouth daily.     donepezil (ARICEPT) 10 MG tablet Take 1 tablet (10 mg total) by mouth at bedtime. 30 tablet 5   escitalopram (LEXAPRO) 5 MG tablet Take 1 tablet (5 mg total) by mouth daily. 30 tablet 5   Famotidine (PEPCID AC PO) Take by mouth daily.     levothyroxine (SYNTHROID,  LEVOTHROID) 50 MCG tablet Take 50 mcg by mouth daily.     loratadine (CLARITIN) 10 MG tablet Take 10 mg by mouth daily as needed for allergies.     Probiotic Product (ALIGN) 4 MG CAPS Take 4 mg by mouth 2 (two) times a week. (Patient not taking: No sig reported)     No current facility-administered medications on file prior to visit.    ALLERGIES: Allergies  Allergen Reactions   Crestor [Rosuvastatin] Itching    All statins   Sulfa Antibiotics Swelling    FAMILY HISTORY: Family History  Problem Relation Age of Onset   Dementia Mother    Cancer Father        throat   Cancer Sister        throat    Cancer Brother        lung   Cancer Paternal Uncle    Cancer Paternal Uncle    Cancer Paternal Uncle    Cancer Paternal Uncle    Heart attack Sister    Diabetes Brother    Cancer Brother        colon   Diabetes Brother    Coronary artery disease Brother    Coronary artery disease Brother       Objective:  Blood pressure 119/68, pulse 92, height 5\' 5"  (1.651 m), weight 128 lb 3.2 oz (58.2 kg), SpO2 94 %. General: No acute distress.  Patient appears well-groomed.   Head:  Normocephalic/atraumatic Eyes:  Fundi examined but not visualized Neck: supple, no paraspinal tenderness, full range of motion Heart:  Regular rate and rhythm Lungs:  Clear to auscultation bilaterally Back: No paraspinal tenderness Neurological Exam: alert and oriented to person, place, and time.  Speech fluent and not dysarthric, language intact.  CN II-XII intact. Bulk and tone normal, muscle strength 5/5 throughout.  Slight postural and kinetic tremor in hands.  Sensation to light touch intact.  Deep tendon reflexes 2+ throughout, toes downgoing.  Finger to nose testing intact.  Gait cautious, Romberg negative.   , DO  CC: Shon Millet, MD

## 2021-07-28 ENCOUNTER — Ambulatory Visit: Payer: PPO | Admitting: Neurology

## 2021-07-28 ENCOUNTER — Other Ambulatory Visit: Payer: Self-pay

## 2021-07-28 ENCOUNTER — Encounter: Payer: Self-pay | Admitting: Neurology

## 2021-07-28 VITALS — BP 119/68 | HR 92 | Ht 65.0 in | Wt 128.2 lb

## 2021-07-28 DIAGNOSIS — E7849 Other hyperlipidemia: Secondary | ICD-10-CM | POA: Diagnosis not present

## 2021-07-28 DIAGNOSIS — F0670 Mild neurocognitive disorder due to known physiological condition without behavioral disturbance: Secondary | ICD-10-CM | POA: Diagnosis not present

## 2021-07-28 DIAGNOSIS — G309 Alzheimer's disease, unspecified: Secondary | ICD-10-CM

## 2021-07-28 DIAGNOSIS — E039 Hypothyroidism, unspecified: Secondary | ICD-10-CM | POA: Diagnosis not present

## 2021-07-28 DIAGNOSIS — E782 Mixed hyperlipidemia: Secondary | ICD-10-CM | POA: Diagnosis not present

## 2021-07-28 DIAGNOSIS — R7301 Impaired fasting glucose: Secondary | ICD-10-CM | POA: Diagnosis not present

## 2021-07-28 DIAGNOSIS — N183 Chronic kidney disease, stage 3 unspecified: Secondary | ICD-10-CM | POA: Diagnosis not present

## 2021-07-28 NOTE — Patient Instructions (Signed)
Stop donepezil.  Contact me in 2 weeks with update. Continue escitalopram 5mg  daily for now. 3.   Follow up as scheduled.

## 2021-07-30 DIAGNOSIS — Z23 Encounter for immunization: Secondary | ICD-10-CM | POA: Diagnosis not present

## 2021-07-30 DIAGNOSIS — R42 Dizziness and giddiness: Secondary | ICD-10-CM | POA: Diagnosis not present

## 2021-07-30 DIAGNOSIS — T466X5A Adverse effect of antihyperlipidemic and antiarteriosclerotic drugs, initial encounter: Secondary | ICD-10-CM | POA: Diagnosis not present

## 2021-07-30 DIAGNOSIS — F0390 Unspecified dementia without behavioral disturbance: Secondary | ICD-10-CM | POA: Diagnosis not present

## 2021-07-30 DIAGNOSIS — M722 Plantar fascial fibromatosis: Secondary | ICD-10-CM | POA: Diagnosis not present

## 2021-07-30 DIAGNOSIS — N189 Chronic kidney disease, unspecified: Secondary | ICD-10-CM | POA: Diagnosis not present

## 2021-07-30 DIAGNOSIS — M791 Myalgia, unspecified site: Secondary | ICD-10-CM | POA: Diagnosis not present

## 2021-07-30 DIAGNOSIS — E7849 Other hyperlipidemia: Secondary | ICD-10-CM | POA: Diagnosis not present

## 2021-08-11 DIAGNOSIS — Z6821 Body mass index (BMI) 21.0-21.9, adult: Secondary | ICD-10-CM | POA: Diagnosis not present

## 2021-08-11 DIAGNOSIS — S6992XA Unspecified injury of left wrist, hand and finger(s), initial encounter: Secondary | ICD-10-CM | POA: Diagnosis not present

## 2021-08-11 DIAGNOSIS — R600 Localized edema: Secondary | ICD-10-CM | POA: Diagnosis not present

## 2021-08-11 DIAGNOSIS — M25539 Pain in unspecified wrist: Secondary | ICD-10-CM | POA: Diagnosis not present

## 2021-08-13 DIAGNOSIS — W010XXA Fall on same level from slipping, tripping and stumbling without subsequent striking against object, initial encounter: Secondary | ICD-10-CM | POA: Diagnosis not present

## 2021-08-13 DIAGNOSIS — M25532 Pain in left wrist: Secondary | ICD-10-CM | POA: Diagnosis not present

## 2021-08-13 DIAGNOSIS — S52502A Unspecified fracture of the lower end of left radius, initial encounter for closed fracture: Secondary | ICD-10-CM | POA: Diagnosis not present

## 2021-08-25 ENCOUNTER — Other Ambulatory Visit: Payer: Self-pay | Admitting: Neurology

## 2021-08-25 DIAGNOSIS — F419 Anxiety disorder, unspecified: Secondary | ICD-10-CM

## 2021-09-01 DIAGNOSIS — M25532 Pain in left wrist: Secondary | ICD-10-CM | POA: Diagnosis not present

## 2021-09-01 DIAGNOSIS — S52502A Unspecified fracture of the lower end of left radius, initial encounter for closed fracture: Secondary | ICD-10-CM | POA: Diagnosis not present

## 2021-09-04 DIAGNOSIS — Z20828 Contact with and (suspected) exposure to other viral communicable diseases: Secondary | ICD-10-CM | POA: Diagnosis not present

## 2021-09-18 ENCOUNTER — Other Ambulatory Visit: Payer: Self-pay

## 2021-09-18 ENCOUNTER — Encounter: Payer: Self-pay | Admitting: Physician Assistant

## 2021-09-18 ENCOUNTER — Ambulatory Visit: Payer: PPO | Admitting: Physician Assistant

## 2021-09-18 VITALS — BP 107/61 | HR 96 | Ht 65.0 in | Wt 128.0 lb

## 2021-09-18 DIAGNOSIS — F0670 Mild neurocognitive disorder due to known physiological condition without behavioral disturbance: Secondary | ICD-10-CM | POA: Diagnosis not present

## 2021-09-18 DIAGNOSIS — G309 Alzheimer's disease, unspecified: Secondary | ICD-10-CM

## 2021-09-18 MED ORDER — RIVASTIGMINE TARTRATE 1.5 MG PO CAPS: ORAL_CAPSULE | ORAL | 11 refills | Status: DC

## 2021-09-18 NOTE — Progress Notes (Signed)
Assessment/Plan:    Mild neurocognitive disorder likely due to Alzheimer's disease  MMSE is stable at 22/30. No significant clinical changes since prior visit. No further UTI's. Currently patient not on antidementia meds, discontinued Aricept due to vivid dreams as side effect.    Recommendations:   Discussed safety both in and out of the home.  Discussed the importance of regular daily schedule to maintain brain function.  Continue to monitor mood by PCP Stay active at least 30 minutes at least 3 times a week.  Naps should be scheduled and should be no longer than 60 minutes and should not occur after 2 PM.  Mediterranean diet is recommended  Control cardiovascular risk factors  Start rivastigmine 1.5 mg twice daily, take 1 tab nightly for 1 week, then increase to 1.5 mg twice daily.  Side effects were discussed Start B12 supplements at 1000 mcg daily to improve its values (191). Follow up in 4  months.   Case discussed with Dr. Karel Jarvis who agrees with the plan     Subjective:    Natalie Morrow is a very pleasant 71 y.o. [facility] female  seen today in follow up for memory loss. This patient is accompanied in the office by her son who supplements the history.  Previous records as well as any outside records available were reviewed prior to todays visit.  Prior studies including EEG on 09/25/2020 showed mild left temporal slowing.  She underwent neuropsychological testing on 10/28/2020 showed mild cognitive deficits in memory and executive function but did not meet criteria for dementia.  PET scan of brain on 03/10/2021 showed decreased relative cortical metabolism within the left parietal lobe as may be seen in Alzheimer's type pathology. Patient was last seen at our office on 08/04/2021 on  at which time she was placed on donepezil 10 mg daily.  However, the patient discontinued this medication as she was experiencing very vivid dreams.  She has not been in any antidementia medication  since then. Since her last visit, the patient continues to have cognitive difficulties.  She continues to repeat the same stories or asked the same questions.  She has her difficulty recognizing her family members.  She denies leaving objects in unusual places, but she does misplace things.  She lives with her husband, who is sick as well and she is caring for him.  She is able to carry simple ADLs.  Her mood is somewhat stable, without depression or irritability.  She sleeps well without vivid dreams or sleepwalking since discontinuing Aricept.  She denies hallucinations or paranoia.  There are no hygiene concerns.  Her son is in charge of the finances.  She is in charge of the medications, occasionally missing a dose.  Her appetite is good, denies trouble swallowing.  She cooks, denies leaving the stove on.  She denies any falls or head injuries.  She does not drive.  She denies any headaches, double vision, dizziness, focal numbness or tingling, unilateral weakness, or anosmia. SHe has mild chronic hand tremors without changes  Denies severe urine incontinence, retention, constipation or diarrhea.     Initial evaluation 07/28/21  On the evening of 08/04/2020, she did not recognize her husband.  She wasn't agitated but didn't want to sleep in the same bed and asked him to leave the house.  The next morning, she still didn't recognize her husband.  Her son came over and she did recognize her son.  They brought her to the ED.  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  CMP showed normal kidney and liver function with no electrolyte abnormalities.  TSH was 1.687.  WBC on CBC was normal.  UA was positive for UTI.  She was discharged on antibiotics.  B12, folate and RPR reportedly normal.  Over the next several weeks, symptoms slowly improved but have not completely resolved.  She will still sometimes  briefly not recognize her husband.  On one occasion, she did not really recognize her son either.  She has still been able to perform all ADLs however she has not been driving since this started.  No headache, fever, cough.  Prior to onset, she had been experiencing dizziness/vertigo.  Also, she appeared to not be as talkative on the phone.  She has been under increased stress caring for her husband who has been having worsening kidney disease and may need to start dialysis.  EEG on 09/25/2020 showed mild left temporal slowing.  She underwent neuropsychological testing on 10/28/2020 showed mild cognitive deficits in memory and executive function but did not meet criteria for dementia.  PET scan of brain on 03/10/2021 showed decreased relative cortical metabolism within the left parietal lobe as may be seen in Alzheimer's type pathology. Mother and maternal aunt dementia. Her sister has been experiencing symptoms as well.  PREVIOUS MEDICATIONS:  Aricept 10 mg daily (vivid dreams)  CURRENT MEDICATIONS:  Outpatient Encounter Medications as of 09/18/2021  Medication Sig   rivastigmine (EXELON) 1.5 MG capsule Take 1 capsule at night for 1 week, then increase to 1 capsule twice daily   acetaminophen (TYLENOL) 500 MG tablet Take 500 mg by mouth as needed for mild pain.    cholecalciferol (VITAMIN D3) 25 MCG (1000 UNIT) tablet Take 1,000 Units by mouth daily.   escitalopram (LEXAPRO) 5 MG tablet TAKE ONE TABLET BY MOUTH ONCE DAILY   Famotidine (PEPCID AC PO) Take by mouth daily.   levothyroxine (SYNTHROID, LEVOTHROID) 50 MCG tablet Take 50 mcg by mouth daily.   loratadine (CLARITIN) 10 MG tablet Take 10 mg by mouth daily as needed for allergies.   Probiotic Product (ALIGN) 4 MG CAPS Take 4 mg by mouth 2 (two) times a week.   No facility-administered encounter medications on file as of 09/18/2021.     Objective:     PHYSICAL EXAMINATION:    VITALS:   Vitals:   09/18/21 1321  BP: 107/61  Pulse: 96   SpO2: 96%  Weight: 128 lb (58.1 kg)  Height: 5\' 5"  (1.651 m)    GEN:  The patient appears stated age and is in NAD. HEENT:  Normocephalic, atraumatic.   Neurological examination:  General: NAD, well-groomed, appears stated age. Orientation: The patient is alert. Oriented to person and place, not to date.  She was unable to draw a clock, or to write the full sentence.  She was unable to copy a design. Cranial nerves: There is good facial symmetry.The speech is fluent and clear. No aphasia or dysarthria. Fund of knowledge is appropriate. Recent and remote memory are impaired. Attention and concentration are reduced.  Able to name objects and repeat phrases.  Hearing is intact to conversational tone.    Sensation: Sensation is intact to light touch throughout Motor: Strength is at least antigravity x4. Tremors: Slight postural and kinetic tremor in hands. DTR's 2/4 in UE/LE    No flowsheet data found. MMSE - Mini Mental State Exam 09/19/2021  10/28/2020  Orientation to time 3 5  Orientation to Place 5 5  Registration 3 3  Attention/ Calculation 2 3  Recall 2 0  Language- name 2 objects 2 2  Language- repeat 1 1  Language- follow 3 step command 3 1  Language- read & follow direction 1 1  Write a sentence 0 1  Copy design 0 0  Total score 22 22    [employer] Mental Exam 09/19/2020  Weekday Correct 1  Current year 1  What state are we in? 1  Amount spent 1  Amount left 0  # of Animals 2  5 objects recall 0  Number series 1  Hour markers 2  Time correct 0  Placed X in triangle correctly 1  Largest Figure 1  Name of female 2  Date back to work 2  Type of work 2  State she lived in 0  Total score 17       Movement examination: Tone: There is normal tone in the UE/LE Abnormal movements:  No myoclonus.  No asterixis.   Coordination:  There is no decremation with RAM's. Normal finger to nose  Gait and Station: The patient has no difficulty arising out of a  deep-seated chair without the use of the hands. The patient's stride length is good.  Gait is cautious and narrow.        Total time spent on today's visit was 60 minutes, including both face-to-face time and nonface-to-face time. Time included that spent on review of records (prior notes available to me/labs/imaging if pertinent), discussing treatment and goals, answering patient's questions and coordinating care.  Cc:  Sasser, Clarene CritchleyPaul W, MD Marlowe KaysSara Javonn Gauger, PA-C

## 2021-09-18 NOTE — Patient Instructions (Addendum)
It was a pleasure to see you today at our office.   Recommendations:  Start rivastigmine 1.5 mg take 1 capsule at night for 1 week then increase to 1 capsule twice daily  Follow up in 3-4 months  B12 was 191, need to make sure you take B12 daily 1000 mcg   RECOMMENDATIONS FOR ALL PATIENTS WITH MEMORY PROBLEMS: 1. Continue to exercise (Recommend 30 minutes of walking everyday, or 3 hours every week) 2. Increase social interactions - continue going to [place] and enjoy social gatherings with friends and family 3. Eat healthy, avoid fried foods and eat more fruits and vegetables 4. Maintain adequate blood pressure, blood sugar, and blood cholesterol level. Reducing the risk of stroke and cardiovascular disease also helps promoting better memory. 5. Avoid stressful situations. Live a simple life and avoid aggravations. Organize your time and prepare for the next day in anticipation. 6. Sleep well, avoid any interruptions of sleep and avoid any distractions in the bedroom that may interfere with adequate sleep quality 7. Avoid sugar, avoid sweets as there is a strong link between excessive sugar intake, diabetes, and cognitive impairment We discussed the Mediterranean diet, which has been shown to help patients reduce the risk of progressive memory disorders and reduces cardiovascular risk. This includes eating fish, eat fruits and green leafy vegetables, nuts like almonds and hazelnuts, walnuts, and also use olive oil. Avoid fast foods and fried foods as much as possible. Avoid sweets and sugar as sugar use has been linked to worsening of memory function.  There is always a concern of gradual progression of memory problems. If this is the case, then we may need to adjust level of care according to patient needs. Support, both to the patient and caregiver, should then be put into place.       FALL PRECAUTIONS: Be cautious when walking. Scan the area for obstacles that may increase the risk of trips  and falls. When getting up in the mornings, sit up at the edge of the bed for a few minutes before getting out of bed. Consider elevating the bed at the head end to avoid drop of blood pressure when getting up. Walk always in a well-lit room (use night lights in the walls). Avoid area rugs or power cords from appliances in the middle of the walkways. Use a walker or a cane if necessary and consider physical therapy for balance exercise. Get your eyesight checked regularly.  FINANCIAL OVERSIGHT: Supervision, especially oversight when making financial decisions or transactions is also recommended.  HOME SAFETY: Consider the safety of the kitchen when operating appliances like stoves, microwave oven, and blender. Consider having supervision and share cooking responsibilities until no longer able to participate in those. Accidents with firearms and other hazards in the house should be identified and addressed as well.   ABILITY TO BE LEFT ALONE: If patient is unable to contact 911 operator, consider using LifeLine, or when the need is there, arrange for someone to stay with patients. Smoking is a fire hazard, consider supervision or cessation. Risk of wandering should be assessed by caregiver and if detected at any point, supervision and safe proof recommendations should be instituted.  MEDICATION SUPERVISION: Inability to self-administer medication needs to be constantly addressed. Implement a mechanism to ensure safe administration of the medications.   [facility] [phone] or [phone]    [place] refers to food and lifestyle choices that are based on the traditions of countries located on the [place]. This way of eating has been shown to help prevent certain conditions and improve outcomes for people who have chronic diseases, like kidney disease and heart disease. What are tips for following this plan? Lifestyle  Cook and eat meals together with  your family, when possible. Drink enough fluid to keep your urine clear or pale yellow. Be physically active every day. This includes: Aerobic exercise like running or swimming. Leisure activities like gardening, walking, or housework. Get 7-8 hours of sleep each night. If recommended by your health care provider, drink red wine in moderation. This means 1 glass a day for nonpregnant women and 2 glasses a day for men. A glass of wine equals 5 oz (150 mL). Reading food labels  Check the serving size of packaged foods. For foods such as rice and pasta, the serving size refers to the amount of cooked product, not dry. Check the total fat in packaged foods. Avoid foods that have saturated fat or trans fats. Check the ingredients list for added sugars, such as corn syrup. Shopping  At the grocery store, buy most of your food from the areas near the walls of the store. This includes: Fresh fruits and vegetables (produce). Grains, beans, nuts, and seeds. Some of these may be available in unpackaged forms or large amounts (in bulk). Fresh seafood. Poultry and eggs. Low-fat dairy products. Buy whole ingredients instead of prepackaged foods. Buy fresh fruits and vegetables in-season from local farmers markets. Buy frozen fruits and vegetables in resealable bags. If you do not have access to quality fresh seafood, buy precooked frozen shrimp or canned fish, such as tuna, salmon, or sardines. Buy small amounts of raw or cooked vegetables, salads, or olives from the deli or salad bar at your store. Stock your pantry so you always have certain foods on hand, such as olive oil, canned tuna, canned tomatoes, rice, pasta, and beans. Cooking  Cook foods with extra-virgin olive oil instead of using butter or other vegetable oils. Have meat as a side dish, and have vegetables or grains as your main dish. This means having meat in small portions or adding small amounts of meat to foods like pasta or stew. Use  beans or vegetables instead of meat in common dishes like chili or lasagna. Experiment with different cooking methods. Try roasting or broiling vegetables instead of steaming or sauteing them. Add frozen vegetables to soups, stews, pasta, or rice. Add nuts or seeds for added healthy fat at each meal. You can add these to yogurt, salads, or vegetable dishes. Marinate fish or vegetables using olive oil, lemon juice, garlic, and fresh herbs. Meal planning  Plan to eat 1 vegetarian meal one day each week. Try to work up to 2 vegetarian meals, if possible. Eat seafood 2 or more times a week. Have healthy snacks readily available, such as: Vegetable sticks with hummus. Greek yogurt. Fruit and nut trail mix. Eat balanced meals throughout the week. This includes: Fruit: 2-3 servings a day Vegetables: 4-5 servings a day Low-fat dairy: 2 servings a day Fish, poultry, or lean meat: 1 serving a day Beans and legumes: 2 or more servings a week Nuts and seeds: 1-2 servings a day Whole grains: 6-8 servings a day Extra-virgin olive oil: 3-4 servings a day Limit red meat and sweets to only a few servings a month What are my food choices? Mediterranean diet Recommended Grains: Whole-grain pasta. Brown rice. Bulgar wheat. Polenta. Couscous. Whole-wheat bread. [place]. Vegetables: Artichokes. Beets. Broccoli. Cabbage. Carrots. Eggplant.  Green beans. Chard. Kale. Spinach. Onions. Leeks. Peas. Squash. Tomatoes. Peppers. Radishes. Fruits: Apples. Apricots. Avocado. Berries. Bananas. Cherries. Dates. Figs. Grapes. Lemons. Melon. Oranges. Peaches. Plums. Pomegranate. Meats and other protein foods: Beans. Almonds. Sunflower seeds. Pine nuts. Peanuts. [place]. Salmon. Scallops. Shrimp. [place]. Tilapia. Clams. Oysters. Eggs. Dairy: Low-fat milk. Cheese. Greek yogurt. Beverages: Water. Red wine. Herbal tea. Fats and oils: Extra virgin olive oil. Avocado oil. Grape seed oil. Sweets and desserts: [place] yogurt  with honey. Baked apples. Poached pears. Trail mix. Seasoning and other foods: Basil. Cilantro. Coriander. Cumin. Mint. Parsley. Sage. Rosemary. Tarragon. Garlic. Oregano. Thyme. Pepper. Balsalmic vinegar. Tahini. Hummus. Tomato sauce. Olives. Mushrooms. Limit these Grains: Prepackaged pasta or rice dishes. Prepackaged cereal with added sugar. Vegetables: Deep fried potatoes (french fries). Fruits: Fruit canned in syrup. Meats and other protein foods: Beef. Pork. Lamb. Poultry with skin. Hot dogs. [place] Salines. Dairy: Ice cream. Sour cream. Whole milk. Beverages: Juice. Sugar-sweetened soft drinks. Beer. Liquor and spirits. Fats and oils: Butter. Canola oil. Vegetable oil. Beef fat (tallow). Lard. Sweets and desserts: Cookies. Cakes. Pies. Candy. Seasoning and other foods: Mayonnaise. Premade sauces and marinades. The items listed may not be a complete list. Talk with your dietitian about what dietary choices are right for you. Summary The Mediterranean diet includes both food and lifestyle choices. Eat a variety of fresh fruits and vegetables, beans, nuts, seeds, and whole grains. Limit the amount of red meat and sweets that you eat. Talk with your health care provider about whether it is safe for you to drink red wine in moderation. This means 1 glass a day for nonpregnant women and 2 glasses a day for men. A glass of wine equals 5 oz (150 mL). This information is not intended to replace advice given to you by your health care provider. Make sure you discuss any questions you have with your health care provider. Document Released: 04/23/2016 Document Revised: 05/26/2016 Document Reviewed: 04/23/2016 Elsevier Interactive Patient Education  2017 [employer].

## 2021-09-24 DIAGNOSIS — M25642 Stiffness of left hand, not elsewhere classified: Secondary | ICD-10-CM | POA: Diagnosis not present

## 2021-09-24 DIAGNOSIS — M25532 Pain in left wrist: Secondary | ICD-10-CM | POA: Diagnosis not present

## 2021-09-24 DIAGNOSIS — R202 Paresthesia of skin: Secondary | ICD-10-CM | POA: Diagnosis not present

## 2021-09-24 DIAGNOSIS — M25632 Stiffness of left wrist, not elsewhere classified: Secondary | ICD-10-CM | POA: Diagnosis not present

## 2021-09-28 IMAGING — PT NM PET TUM IMG INITIAL (PI) WHOLE BODY
1 of 6 series · 3 of 25 positions shown · non-contrast
Comparison: Brain MRI 02/02/2021

CLINICAL DATA: 69-year-old female with cognitive impairment. Memory
loss. Frontotemporal dementia versus Alzheimer's type dementia

EXAM:
NM PET METABOLIC BRAIN
TECHNIQUE: 10.3 mCi F-18 FDG was injected intravenously. Full-ring PET imaging
was performed from the vertex to skull base. CT data was obtained
and used for attenuation correction and anatomic localization.
FASTING BLOOD GLUCOSE:  Value: 98 mg/dl

[Series 4: ct brain 3.0 hf38 · axial · 3.0mm · 0.42mm/px · z∈[-132,-50]mm · 3 of 83 slices shown]
[im 21/83  brain]
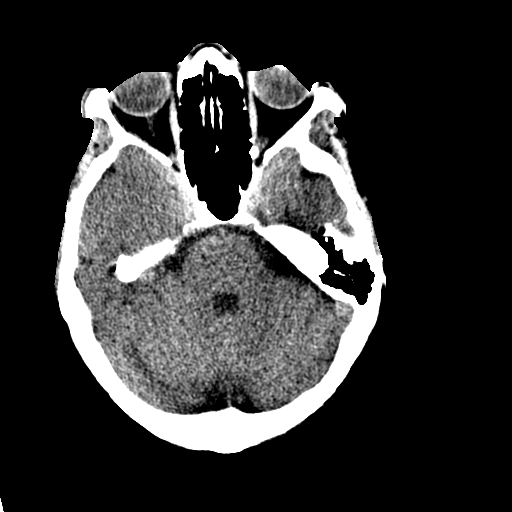
[im 42/83  brain]
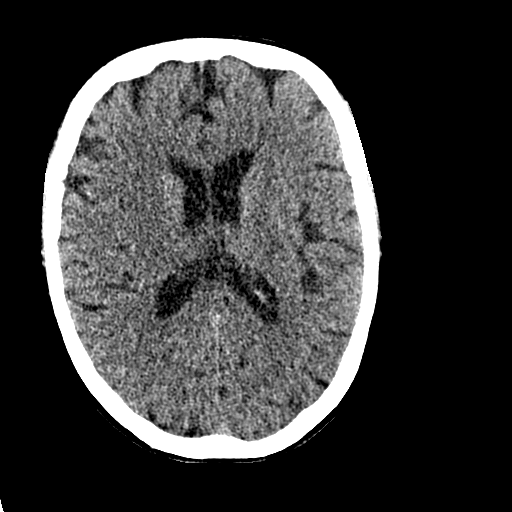
[im 62/83  brain]
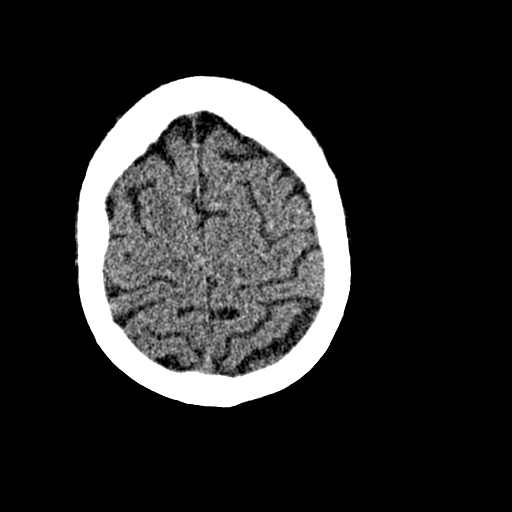

[3 of 25 positions shown; findings below may reference images not displayed]

FINDINGS: There is decreased relative cortical metabolism within the
posterolateral LEFT parietal lobe when compared to the contralateral
parietal lobe and frontal lobes. Symmetric decreased activity in the
temporal lobes is nonspecific. Normal relative cortical metabolism
within the occipital lobes.
IMPRESSION: Decreased relative cortical metabolism within the LEFT parietal lobe
is a pattern which can be found in Alzheimer's type pathology.

Of note, FDG metabolic brain imaging is an adjuvant test used as in
conjunction with clinical evaluation in assessing cognitive
impairment.

## 2021-10-01 DIAGNOSIS — R202 Paresthesia of skin: Secondary | ICD-10-CM | POA: Diagnosis not present

## 2021-10-01 DIAGNOSIS — M25532 Pain in left wrist: Secondary | ICD-10-CM | POA: Diagnosis not present

## 2021-10-01 DIAGNOSIS — M25642 Stiffness of left hand, not elsewhere classified: Secondary | ICD-10-CM | POA: Diagnosis not present

## 2021-10-01 DIAGNOSIS — M25632 Stiffness of left wrist, not elsewhere classified: Secondary | ICD-10-CM | POA: Diagnosis not present

## 2021-10-03 DIAGNOSIS — R202 Paresthesia of skin: Secondary | ICD-10-CM | POA: Diagnosis not present

## 2021-10-03 DIAGNOSIS — M25632 Stiffness of left wrist, not elsewhere classified: Secondary | ICD-10-CM | POA: Diagnosis not present

## 2021-10-03 DIAGNOSIS — M25532 Pain in left wrist: Secondary | ICD-10-CM | POA: Diagnosis not present

## 2021-10-03 DIAGNOSIS — M25642 Stiffness of left hand, not elsewhere classified: Secondary | ICD-10-CM | POA: Diagnosis not present

## 2021-10-08 DIAGNOSIS — M25632 Stiffness of left wrist, not elsewhere classified: Secondary | ICD-10-CM | POA: Diagnosis not present

## 2021-10-08 DIAGNOSIS — M25532 Pain in left wrist: Secondary | ICD-10-CM | POA: Diagnosis not present

## 2021-10-08 DIAGNOSIS — R202 Paresthesia of skin: Secondary | ICD-10-CM | POA: Diagnosis not present

## 2021-10-08 DIAGNOSIS — M25642 Stiffness of left hand, not elsewhere classified: Secondary | ICD-10-CM | POA: Diagnosis not present

## 2021-10-10 DIAGNOSIS — R202 Paresthesia of skin: Secondary | ICD-10-CM | POA: Diagnosis not present

## 2021-10-10 DIAGNOSIS — M25532 Pain in left wrist: Secondary | ICD-10-CM | POA: Diagnosis not present

## 2021-10-10 DIAGNOSIS — M25632 Stiffness of left wrist, not elsewhere classified: Secondary | ICD-10-CM | POA: Diagnosis not present

## 2021-10-10 DIAGNOSIS — M25642 Stiffness of left hand, not elsewhere classified: Secondary | ICD-10-CM | POA: Diagnosis not present

## 2021-10-15 DIAGNOSIS — M25532 Pain in left wrist: Secondary | ICD-10-CM | POA: Diagnosis not present

## 2021-10-15 DIAGNOSIS — M25642 Stiffness of left hand, not elsewhere classified: Secondary | ICD-10-CM | POA: Diagnosis not present

## 2021-10-15 DIAGNOSIS — M25632 Stiffness of left wrist, not elsewhere classified: Secondary | ICD-10-CM | POA: Diagnosis not present

## 2021-10-15 DIAGNOSIS — R202 Paresthesia of skin: Secondary | ICD-10-CM | POA: Diagnosis not present

## 2021-10-17 DIAGNOSIS — R202 Paresthesia of skin: Secondary | ICD-10-CM | POA: Diagnosis not present

## 2021-10-17 DIAGNOSIS — M25642 Stiffness of left hand, not elsewhere classified: Secondary | ICD-10-CM | POA: Diagnosis not present

## 2021-10-17 DIAGNOSIS — M25532 Pain in left wrist: Secondary | ICD-10-CM | POA: Diagnosis not present

## 2021-10-17 DIAGNOSIS — M25632 Stiffness of left wrist, not elsewhere classified: Secondary | ICD-10-CM | POA: Diagnosis not present

## 2021-10-22 DIAGNOSIS — M25642 Stiffness of left hand, not elsewhere classified: Secondary | ICD-10-CM | POA: Diagnosis not present

## 2021-10-22 DIAGNOSIS — M25532 Pain in left wrist: Secondary | ICD-10-CM | POA: Diagnosis not present

## 2021-10-22 DIAGNOSIS — M25632 Stiffness of left wrist, not elsewhere classified: Secondary | ICD-10-CM | POA: Diagnosis not present

## 2021-10-22 DIAGNOSIS — R202 Paresthesia of skin: Secondary | ICD-10-CM | POA: Diagnosis not present

## 2021-10-24 DIAGNOSIS — M25642 Stiffness of left hand, not elsewhere classified: Secondary | ICD-10-CM | POA: Diagnosis not present

## 2021-10-24 DIAGNOSIS — M25632 Stiffness of left wrist, not elsewhere classified: Secondary | ICD-10-CM | POA: Diagnosis not present

## 2021-10-24 DIAGNOSIS — M25532 Pain in left wrist: Secondary | ICD-10-CM | POA: Diagnosis not present

## 2021-10-24 DIAGNOSIS — R202 Paresthesia of skin: Secondary | ICD-10-CM | POA: Diagnosis not present

## 2021-10-27 DIAGNOSIS — M25532 Pain in left wrist: Secondary | ICD-10-CM | POA: Diagnosis not present

## 2021-10-28 ENCOUNTER — Other Ambulatory Visit: Payer: Self-pay | Admitting: Neurology

## 2021-10-28 DIAGNOSIS — F419 Anxiety disorder, unspecified: Secondary | ICD-10-CM

## 2021-10-29 DIAGNOSIS — M25632 Stiffness of left wrist, not elsewhere classified: Secondary | ICD-10-CM | POA: Diagnosis not present

## 2021-10-29 DIAGNOSIS — R202 Paresthesia of skin: Secondary | ICD-10-CM | POA: Diagnosis not present

## 2021-10-29 DIAGNOSIS — M25532 Pain in left wrist: Secondary | ICD-10-CM | POA: Diagnosis not present

## 2021-10-29 DIAGNOSIS — M25642 Stiffness of left hand, not elsewhere classified: Secondary | ICD-10-CM | POA: Diagnosis not present

## 2021-10-31 DIAGNOSIS — R202 Paresthesia of skin: Secondary | ICD-10-CM | POA: Diagnosis not present

## 2021-10-31 DIAGNOSIS — M25632 Stiffness of left wrist, not elsewhere classified: Secondary | ICD-10-CM | POA: Diagnosis not present

## 2021-10-31 DIAGNOSIS — M25642 Stiffness of left hand, not elsewhere classified: Secondary | ICD-10-CM | POA: Diagnosis not present

## 2021-10-31 DIAGNOSIS — M25532 Pain in left wrist: Secondary | ICD-10-CM | POA: Diagnosis not present

## 2021-11-05 DIAGNOSIS — R202 Paresthesia of skin: Secondary | ICD-10-CM | POA: Diagnosis not present

## 2021-11-05 DIAGNOSIS — M25632 Stiffness of left wrist, not elsewhere classified: Secondary | ICD-10-CM | POA: Diagnosis not present

## 2021-11-05 DIAGNOSIS — M25532 Pain in left wrist: Secondary | ICD-10-CM | POA: Diagnosis not present

## 2021-11-05 DIAGNOSIS — M25642 Stiffness of left hand, not elsewhere classified: Secondary | ICD-10-CM | POA: Diagnosis not present

## 2021-11-07 DIAGNOSIS — M25532 Pain in left wrist: Secondary | ICD-10-CM | POA: Diagnosis not present

## 2021-11-07 DIAGNOSIS — R202 Paresthesia of skin: Secondary | ICD-10-CM | POA: Diagnosis not present

## 2021-11-07 DIAGNOSIS — M25642 Stiffness of left hand, not elsewhere classified: Secondary | ICD-10-CM | POA: Diagnosis not present

## 2021-11-07 DIAGNOSIS — M25632 Stiffness of left wrist, not elsewhere classified: Secondary | ICD-10-CM | POA: Diagnosis not present

## 2021-11-17 DIAGNOSIS — R202 Paresthesia of skin: Secondary | ICD-10-CM | POA: Diagnosis not present

## 2021-11-17 DIAGNOSIS — M25532 Pain in left wrist: Secondary | ICD-10-CM | POA: Diagnosis not present

## 2021-11-17 DIAGNOSIS — M25632 Stiffness of left wrist, not elsewhere classified: Secondary | ICD-10-CM | POA: Diagnosis not present

## 2021-11-17 DIAGNOSIS — M25642 Stiffness of left hand, not elsewhere classified: Secondary | ICD-10-CM | POA: Diagnosis not present

## 2021-11-21 DIAGNOSIS — M25642 Stiffness of left hand, not elsewhere classified: Secondary | ICD-10-CM | POA: Diagnosis not present

## 2021-11-21 DIAGNOSIS — M25632 Stiffness of left wrist, not elsewhere classified: Secondary | ICD-10-CM | POA: Diagnosis not present

## 2021-11-21 DIAGNOSIS — R202 Paresthesia of skin: Secondary | ICD-10-CM | POA: Diagnosis not present

## 2021-11-21 DIAGNOSIS — M25532 Pain in left wrist: Secondary | ICD-10-CM | POA: Diagnosis not present

## 2021-11-24 DIAGNOSIS — R202 Paresthesia of skin: Secondary | ICD-10-CM | POA: Diagnosis not present

## 2021-11-24 DIAGNOSIS — M25642 Stiffness of left hand, not elsewhere classified: Secondary | ICD-10-CM | POA: Diagnosis not present

## 2021-11-24 DIAGNOSIS — M25632 Stiffness of left wrist, not elsewhere classified: Secondary | ICD-10-CM | POA: Diagnosis not present

## 2021-11-24 DIAGNOSIS — M25532 Pain in left wrist: Secondary | ICD-10-CM | POA: Diagnosis not present

## 2021-11-25 ENCOUNTER — Other Ambulatory Visit: Payer: Self-pay | Admitting: Neurology

## 2021-11-25 DIAGNOSIS — R202 Paresthesia of skin: Secondary | ICD-10-CM | POA: Diagnosis not present

## 2021-11-25 DIAGNOSIS — M25532 Pain in left wrist: Secondary | ICD-10-CM | POA: Diagnosis not present

## 2021-11-25 DIAGNOSIS — M25632 Stiffness of left wrist, not elsewhere classified: Secondary | ICD-10-CM | POA: Diagnosis not present

## 2021-11-25 DIAGNOSIS — F419 Anxiety disorder, unspecified: Secondary | ICD-10-CM

## 2021-11-25 DIAGNOSIS — M25642 Stiffness of left hand, not elsewhere classified: Secondary | ICD-10-CM | POA: Diagnosis not present

## 2021-12-01 NOTE — Telephone Encounter (Signed)
Celina from [facility] called to follow up on this request. ?

## 2021-12-03 DIAGNOSIS — R202 Paresthesia of skin: Secondary | ICD-10-CM | POA: Diagnosis not present

## 2021-12-03 DIAGNOSIS — M25642 Stiffness of left hand, not elsewhere classified: Secondary | ICD-10-CM | POA: Diagnosis not present

## 2021-12-03 DIAGNOSIS — M25532 Pain in left wrist: Secondary | ICD-10-CM | POA: Diagnosis not present

## 2021-12-03 DIAGNOSIS — M25632 Stiffness of left wrist, not elsewhere classified: Secondary | ICD-10-CM | POA: Diagnosis not present

## 2021-12-05 DIAGNOSIS — M25632 Stiffness of left wrist, not elsewhere classified: Secondary | ICD-10-CM | POA: Diagnosis not present

## 2021-12-05 DIAGNOSIS — R202 Paresthesia of skin: Secondary | ICD-10-CM | POA: Diagnosis not present

## 2021-12-05 DIAGNOSIS — M25642 Stiffness of left hand, not elsewhere classified: Secondary | ICD-10-CM | POA: Diagnosis not present

## 2021-12-05 DIAGNOSIS — M25532 Pain in left wrist: Secondary | ICD-10-CM | POA: Diagnosis not present

## 2021-12-10 DIAGNOSIS — M25642 Stiffness of left hand, not elsewhere classified: Secondary | ICD-10-CM | POA: Diagnosis not present

## 2021-12-10 DIAGNOSIS — R202 Paresthesia of skin: Secondary | ICD-10-CM | POA: Diagnosis not present

## 2021-12-10 DIAGNOSIS — M25632 Stiffness of left wrist, not elsewhere classified: Secondary | ICD-10-CM | POA: Diagnosis not present

## 2021-12-10 DIAGNOSIS — M25532 Pain in left wrist: Secondary | ICD-10-CM | POA: Diagnosis not present

## 2021-12-15 ENCOUNTER — Encounter: Payer: Self-pay | Admitting: Physician Assistant

## 2021-12-15 ENCOUNTER — Ambulatory Visit: Payer: PPO | Admitting: Physician Assistant

## 2021-12-15 VITALS — BP 114/71 | HR 71 | Resp 18 | Ht 65.0 in | Wt 130.0 lb

## 2021-12-15 DIAGNOSIS — M62838 Other muscle spasm: Secondary | ICD-10-CM | POA: Diagnosis not present

## 2021-12-15 DIAGNOSIS — R259 Unspecified abnormal involuntary movements: Secondary | ICD-10-CM | POA: Diagnosis not present

## 2021-12-15 DIAGNOSIS — G309 Alzheimer's disease, unspecified: Secondary | ICD-10-CM | POA: Diagnosis not present

## 2021-12-15 DIAGNOSIS — F0670 Mild neurocognitive disorder due to known physiological condition without behavioral disturbance: Secondary | ICD-10-CM

## 2021-12-15 MED ORDER — RIVASTIGMINE TARTRATE 3 MG PO CAPS: ORAL_CAPSULE | ORAL | 11 refills | Status: DC

## 2021-12-15 NOTE — Progress Notes (Signed)
? ?Assessment/Plan:  ? ? 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 ? Tolerating rivastigmine 1.5 mg bid.  However, memory is worsening, she demonstrated significant difficulty remembering words.  Delayed recall 1/3.  In addition, she again reports night restlessness, I the past, EEG demonstrated some mild temporal slowing. ? ? Recommendations:  ? ?Discussed safety both in and out of the home.  ?Discussed the importance of regular daily schedule to maintain brain function.  ?Continue to monitor mood by PCP ?Stay active at least 30 minutes at least 3 times a week.  ?Naps should be scheduled and should be no longer than 60 minutes and should not occur after 2 PM.  ?Mediterranean diet is recommended  ?Control cardiovascular risk factors  ?Increase rivastigmine to 3 mg twice daily, side effects discussed ?Continue B12 supplements at 1000 mcg daily  ?We will consider checking iron levels for restless legs. ?1 hour EEG rule out seizure activity ?Repeat neurocognitive testing in 6- 12 months ?Follow up in 6 months. ? ? ?Case discussed with Dr Everlena Cooper who agrees with the plan ? ? ? ? ?Subjective:  ? ? ?Natalie Morrow is a very pleasant 71 y.o. LH female  seen today in follow up for memory loss. This patient is accompanied in the office by her son who supplements the history.  Previous records as well as any outside records available were reviewed prior to todays visit.  Prior studies including EEG on 09/25/2020 showed mild left temporal slowing. Neuropsychological testing on 10/28/2020 showed mild cognitive deficits in memory and executive function but did not meet criteria for dementia.  PET scan of brain on 03/10/2021 showed decreased relative cortical metabolism within the left parietal lobe as may be seen in Alzheimer's type pathology. Patient was last seen at our office on 09/16/21. She is on rivastigmine 1.5 mg bid tolerating well.  ? ?Since her last visit, the patient continues to have cognitive  difficulties.  She continues to repeat the same stories or asked the same questions.  She has her difficulty recognizing her family members.  She denies leaving objects in unusual places, but she does misplace things.  She lives with her husband, who is sick as well and she is caring for him.  She is able to carry simple ADLs.  Her mood is somewhat stable, without depression or irritability.  She sleeps well without vivid dreams, hallucinations or paranoia.  There are no hygiene concerns.  Her son is in charge of the finances.  She is in charge of the medications, occasionally missing a dose.  Her appetite is good, denies trouble swallowing.  She cooks, denies leaving the stove on.  She denies any falls or head injuries.  She does not drive.  She denies any headaches, double vision, dizziness, focal numbness or tingling, unilateral weakness, or anosmia. She has mild chronic hand tremors, and at night, her husband is concerned of her having involuntary movements without other seizure complaints.  Denies severe urine incontinence, retention, constipation or diarrhea.    ? ?Initial evaluation 07/28/21  ?On the evening of 08/04/2020, she did not recognize her husband.  She wasn't agitated but didn't want to sleep in the same bed and asked him to leave the house.  The next morning, she still didn't recognize her husband.  Her son came over and she did recognize her son.  They brought her to the ED.  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  CMP showed normal kidney and liver function with no electrolyte abnormalities.  TSH was 1.687.  WBC on CBC was normal.  UA was positive for UTI.  She was discharged on antibiotics.  B12, folate and RPR reportedly normal.  Over the next several weeks, symptoms slowly improved but have not completely resolved.  She will still sometimes briefly not recognize her husband.  On one  occasion, she did not really recognize her son either.  She has still been able to perform all ADLs however she has not been driving since this started.  No headache, fever, cough.  Prior to onset, she had been experiencing dizziness/vertigo.  Also, she appeared to not be as talkative on the phone.  She has been under increased stress caring for her husband who has been having worsening kidney disease and may need to start dialysis.  EEG on 09/25/2020 showed mild left temporal slowing.  She underwent neuropsychological testing on 10/28/2020 showed mild cognitive deficits in memory and executive function but did not meet criteria for dementia.  PET scan of brain on 03/10/2021 showed decreased relative cortical metabolism within the left parietal lobe as may be seen in Alzheimer's type pathology. Mother and maternal aunt dementia. Her sister has been experiencing symptoms as well. ? ?PREVIOUS MEDICATIONS:  Aricept 10 mg daily (vivid dreams) ? ?CURRENT MEDICATIONS:  ?Outpatient Encounter Medications as of 12/15/2021  ?Medication Sig  ? acetaminophen (TYLENOL) 500 MG tablet Take 500 mg by mouth as needed for mild pain.   ? cholecalciferol (VITAMIN D3) 25 MCG (1000 UNIT) tablet Take 1,000 Units by mouth daily.  ? escitalopram (LEXAPRO) 5 MG tablet TAKE ONE TABLET BY MOUTH ONCE DAILY  ? Famotidine (PEPCID AC PO) Take by mouth daily.  ? levothyroxine (SYNTHROID, LEVOTHROID) 50 MCG tablet Take 50 mcg by mouth daily.  ? loratadine (CLARITIN) 10 MG tablet Take 10 mg by mouth daily as needed for allergies.  ? Probiotic Product (ALIGN) 4 MG CAPS Take 4 mg by mouth 2 (two) times a week.  ? rivastigmine (EXELON) 1.5 MG capsule Take 1 capsule at night for 1 week, then increase to 1 capsule twice daily  ? ?No facility-administered encounter medications on file as of 12/15/2021.  ? ? ? ?Objective:  ?  ? ?PHYSICAL EXAMINATION:   ? ?VITALS:   ?Vitals:  ? 12/15/21 1254  ?BP: 114/71  ?Pulse: 71  ?Resp: 18  ?SpO2: 97%  ?Weight: 130 lb (59 kg)   ?Height: 5\' 5"  (1.651 m)  ? ? ? ?GEN:  The patient appears stated age and is in NAD. ?HEENT:  Normocephalic, atraumatic.  ? ?Neurological examination: ? ?General: NAD, well-groomed, appears stated age. ?Orientation: The patient is alert. Oriented to person and place, not to date.   ?Cranial nerves: There is good facial symmetry.The speech is fluent and clear. No aphasia or dysarthria. Fund of knowledge is reduced. Recent and remote memory are impaired. Attention and concentration are reduced.  Able to name objects and repeat phrases.  Hearing is intact to conversational tone.    ?Sensation: Sensation is intact to light touch throughout ?Motor: Strength is at least antigravity x4. ?Tremors: Slight postural and kinetic tremor in hands. ?DTR's 2/4 in UE/LE  ? ?  ?   ? View : No data to display.  ?  ?  ?  ? ? ?  09/19/2021  ?  8:00 AM 10/28/2020  ? 10:00 AM  ?MMSE - Mini Mental State Exam  ?  Orientation to time 3 5  ?Orientation to Place 5 5  ?Registration 3 3  ?Attention/ Calculation 2 3  ?Recall 2 0  ?Language- name 2 objects 2 2  ?Language- repeat 1 1  ?Language- follow 3 step command 3 1  ?Language- read & follow direction 1 1  ?Write a sentence 0 1  ?Copy design 0 0  ?Total score 22 22  ?  ? ?  09/19/2020  ?  2:00 PM  ?[employer] Mental Exam  ?Weekday Correct 1  ?Current year 1  ?What state are we in? 1  ?Amount spent 1  ?Amount left 0  ?# of Animals 2  ?5 objects recall 0  ?Number series 1  ?Hour markers 2  ?Time correct 0  ?Placed X in triangle correctly 1  ?Largest Figure 1  ?Name of female 2  ?Date back to work 2  ?Type of work 2  ?State she lived in 0  ?Total score 17  ?  ? ?  ?Movement examination: ?Tone: There is normal tone in the UE/LE ?Abnormal movements:  No myoclonus.  No asterixis.   ?Coordination:  There is no decremation with RAM's. Normal finger to nose  ?Gait and Station: The patient has no difficulty arising out of a deep-seated chair without the use of the hands. The patient's stride length  is good.  Gait is cautious and narrow.  ? ? ?  ? ? ?Total time spent on today's visit was 40 minutes, including both face-to-face time and nonface-to-face time. Time included that spent on review of re

## 2021-12-15 NOTE — Patient Instructions (Signed)
It was a pleasure to see you today at our office.  ? ?Recommendations: ? ?Increase rivastigmine 3  mg  twice daily  ?EEG, the office will schedule ?Follow up in 6 months  ?Continue taking B12 daily 1000 mcg  ? ? ?RECOMMENDATIONS FOR ALL PATIENTS WITH MEMORY PROBLEMS: ?1. Continue to exercise (Recommend 30 minutes of walking everyday, or 3 hours every week) ?2. Increase social interactions - continue going to [place] and enjoy social gatherings with friends and family ?3. Eat healthy, avoid fried foods and eat more fruits and vegetables ?4. Maintain adequate blood pressure, blood sugar, and blood cholesterol level. Reducing the risk of stroke and cardiovascular disease also helps promoting better memory. ?5. Avoid stressful situations. Live a simple life and avoid aggravations. Organize your time and prepare for the next day in anticipation. ?6. Sleep well, avoid any interruptions of sleep and avoid any distractions in the bedroom that may interfere with adequate sleep quality ?7. Avoid sugar, avoid sweets as there is a strong link between excessive sugar intake, diabetes, and cognitive impairment ?We discussed the Mediterranean diet, which has been shown to help patients reduce the risk of progressive memory disorders and reduces cardiovascular risk. This includes eating fish, eat fruits and green leafy vegetables, nuts like almonds and hazelnuts, walnuts, and also use olive oil. Avoid fast foods and fried foods as much as possible. Avoid sweets and sugar as sugar use has been linked to worsening of memory function. ? ?There is always a concern of gradual progression of memory problems. If this is the case, then we may need to adjust level of care according to patient needs. Support, both to the patient and caregiver, should then be put into place.  ? ? ? ? ? ?FALL PRECAUTIONS: Be cautious when walking. Scan the area for obstacles that may increase the risk of trips and falls. When getting up in the mornings, sit up  at the edge of the bed for a few minutes before getting out of bed. Consider elevating the bed at the head end to avoid drop of blood pressure when getting up. Walk always in a well-lit room (use night lights in the walls). Avoid area rugs or power cords from appliances in the middle of the walkways. Use a walker or a cane if necessary and consider physical therapy for balance exercise. Get your eyesight checked regularly. ? ?FINANCIAL OVERSIGHT: Supervision, especially oversight when making financial decisions or transactions is also recommended. ? ?HOME SAFETY: Consider the safety of the kitchen when operating appliances like stoves, microwave oven, and blender. Consider having supervision and share cooking responsibilities until no longer able to participate in those. Accidents with firearms and other hazards in the house should be identified and addressed as well. ? ? ?ABILITY TO BE LEFT ALONE: If patient is unable to contact 911 operator, consider using LifeLine, or when the need is there, arrange for someone to stay with patients. Smoking is a fire hazard, consider supervision or cessation. Risk of wandering should be assessed by caregiver and if detected at any point, supervision and safe proof recommendations should be instituted. ? ?MEDICATION SUPERVISION: Inability to self-administer medication needs to be constantly addressed. Implement a mechanism to ensure safe administration of the medications. ? ? ?[facility] [phone] or [phone] ? ? ? ?Mediterranean Diet ?A Mediterranean diet refers to food and lifestyle choices that are based on the traditions of countries located on the [employer]. This way of eating has been shown to help prevent certain  conditions and improve outcomes for people who have chronic diseases, like kidney disease and heart disease. ?What are tips for following this plan? ?Lifestyle  ?Cook and eat meals together with your family, when possible. ?Drink enough fluid to  keep your urine clear or pale yellow. ?Be physically active every day. This includes: ?Aerobic exercise like running or swimming. ?Leisure activities like gardening, walking, or housework. ?Get 7-8 hours of sleep each night. ?If recommended by your health care provider, drink red wine in moderation. This means 1 glass a day for nonpregnant women and 2 glasses a day for men. A glass of wine equals 5 oz (150 mL). ?Reading food labels  ?Check the serving size of packaged foods. For foods such as rice and pasta, the serving size refers to the amount of cooked product, not dry. ?Check the total fat in packaged foods. Avoid foods that have saturated fat or trans fats. ?Check the ingredients list for added sugars, such as corn syrup. ?Shopping  ?At the grocery store, buy most of your food from the areas near the walls of the store. This includes: ?Fresh fruits and vegetables (produce). ?Grains, beans, nuts, and seeds. Some of these may be available in unpackaged forms or large amounts (in bulk). ?Fresh seafood. ?Poultry and eggs. ?Low-fat dairy products. ?Buy whole ingredients instead of prepackaged foods. ?Buy fresh fruits and vegetables in-season from local farmers markets. ?Buy frozen fruits and vegetables in resealable bags. ?If you do not have access to quality fresh seafood, buy precooked frozen shrimp or canned fish, such as tuna, salmon, or sardines. ?Buy small amounts of raw or cooked vegetables, salads, or olives from the deli or salad bar at your store. ?Stock your pantry so you always have certain foods on hand, such as olive oil, canned tuna, canned tomatoes, rice, pasta, and beans. ?Cooking  ?Cook foods with extra-virgin olive oil instead of using butter or other vegetable oils. ?Have meat as a side dish, and have vegetables or grains as your main dish. This means having meat in small portions or adding small amounts of meat to foods like pasta or stew. ?Use beans or vegetables instead of meat in common  dishes like chili or lasagna. ?Experiment with different cooking methods. Try roasting or broiling vegetables instead of steaming or saut?eing them. ?Add frozen vegetables to soups, stews, pasta, or rice. ?Add nuts or seeds for added healthy fat at each meal. You can add these to yogurt, salads, or vegetable dishes. ?Marinate fish or vegetables using olive oil, lemon juice, garlic, and fresh herbs. ?Meal planning  ?Plan to eat 1 vegetarian meal one day each week. Try to work up to 2 vegetarian meals, if possible. ?Eat seafood 2 or more times a week. ?Have healthy snacks readily available, such as: ?Vegetable sticks with hummus. ?[place] yogurt. ?Fruit and nut trail mix. ?Eat balanced meals throughout the week. This includes: ?Fruit: 2-3 servings a day ?Vegetables: 4-5 servings a day ?Low-fat dairy: 2 servings a day ?Fish, poultry, or lean meat: 1 serving a day ?Beans and legumes: 2 or more servings a week ?Nuts and seeds: 1-2 servings a day ?Whole grains: 6-8 servings a day ?Extra-virgin olive oil: 3-4 servings a day ?Limit red meat and sweets to only a few servings a month ?What are my food choices? ?Mediterranean diet ?Recommended ?Grains: Whole-grain pasta. Brown rice. Bulgar wheat. Polenta. Couscous. Whole-wheat bread. Orpah Cobb. ?Vegetables: Artichokes. Beets. Broccoli. Cabbage. Carrots. Eggplant. Green beans. Chard. Kale. Spinach. Onions. Leeks. Peas. Squash. Tomatoes. Peppers. Radishes. ?  Fruits: Apples. Apricots. Avocado. Berries. Bananas. Cherries. Dates. Figs. Grapes. Lemons. Melon. Oranges. Peaches. Plums. Pomegranate. ?Meats and other protein foods: Beans. Almonds. Sunflower seeds. Pine nuts. Peanuts. Cod. Salmon. Scallops. Shrimp. Tuna. Tilapia. Clams. Oysters. Eggs. ?Dairy: Low-fat milk. Cheese. Greek yogurt. ?Beverages: Water. Red wine. Herbal tea. ?Fats and oils: Extra virgin olive oil. Avocado oil. Grape seed oil. ?Sweets and desserts: [place] yogurt with honey. Baked apples. Poached pears. Trail  mix. ?Seasoning and other foods: Basil. Cilantro. Coriander. Cumin. Mint. Parsley. Sage. Rosemary. Tarragon. Garlic. Oregano. Thyme. Pepper. Balsalmic vinegar. Tahini. Hummus. Tomato sauce. Olives. Mushroo

## 2021-12-23 ENCOUNTER — Ambulatory Visit: Payer: PPO | Admitting: Neurology

## 2021-12-23 DIAGNOSIS — R259 Unspecified abnormal involuntary movements: Secondary | ICD-10-CM | POA: Diagnosis not present

## 2021-12-24 ENCOUNTER — Ambulatory Visit: Payer: PPO | Admitting: Physician Assistant

## 2021-12-26 ENCOUNTER — Other Ambulatory Visit: Payer: Self-pay

## 2021-12-26 DIAGNOSIS — F419 Anxiety disorder, unspecified: Secondary | ICD-10-CM

## 2021-12-26 MED ORDER — ESCITALOPRAM OXALATE 5 MG PO TABS: 5.0000 mg | ORAL_TABLET | Freq: Every day | ORAL | 0 refills | Status: DC

## 2022-01-08 ENCOUNTER — Telehealth: Payer: Self-pay | Admitting: Physician Assistant

## 2022-01-08 NOTE — Telephone Encounter (Signed)
Patient advised.

## 2022-01-08 NOTE — Telephone Encounter (Signed)
Patient's son Jenny Reichmann called for recent EEG results. ?

## 2022-01-14 NOTE — Procedures (Signed)
ELECTROENCEPHALOGRAM REPORT ? ?Date of Study: 12/23/2021 ? ?Patient's Name: Natalie Morrow ?MRN: [id number] ?Date of Birth: [birth year] ? ?Referring Provider: Sharene Butters, PA-C ? ?Clinical History: This is a 72 year old woman with dementia and abnormal involuntary movements. EEG for classification. ? ?Medications: ?TYLENOL 500 MG tablet ?VITAMIN D3 25 MCG (1000 UNIT) tablet ?LEXAPRO 5 MG tablet ?PEPCID AC PO ?SYNTHROID, LEVOTHROID 50 MCG tablet ?CLARITIN 10 MG tablet ?ALIGN 4 MG CAPS ?EXELON 1.5 MG capsule ? ?Technical Summary: ?A multichannel digital 1-hour EEG recording measured by the international 10-20 system with electrodes applied with paste and impedances below 5000 ohms performed as portable with EKG monitoring in an awake and asleep patient.  Hyperventilation was not performed. Photic stimulation was performed.  The digital EEG was referentially recorded, reformatted, and digitally filtered in a variety of bipolar and referential montages for optimal display.  ? ?Description: ?The patient is awake and asleep during the recording.  During maximal wakefulness, there is no clear posterior dominant rhythm seen. There is a small amount of diffuse 4-5 Hz theta slowing of the waking background.  During drowsiness and sleep, there is an increase in theta and delta slowing of the background. Poorly formed vertex waves were seen.  Photic stimulation did not elicit any abnormalities.  There were no epileptiform discharges or electrographic seizures seen.   ? ?EKG lead was unremarkable. ? ?Impression: ?This 1-hour awake and asleep EEG is abnormal due to mild diffuse slowing of the waking background. ? ?Clinical Correlation of the above findings indicates diffuse cerebral dysfunction that is non-specific in etiology and can be seen with hypoxic/ischemic injury, toxic/metabolic encephalopathies, neurodegenerative disorders, or medication effect.  Typical events were not captured. The absence of epileptiform discharges  does not rule out a clinical diagnosis of epilepsy.  Clinical correlation is advised. ? ? ?Ellouise Newer, M.D. ? ?

## 2022-01-17 NOTE — Progress Notes (Signed)
Please inform patient EEG is unremarkable, no seizures thanks

## 2022-01-22 DIAGNOSIS — R7301 Impaired fasting glucose: Secondary | ICD-10-CM | POA: Diagnosis not present

## 2022-01-22 DIAGNOSIS — E039 Hypothyroidism, unspecified: Secondary | ICD-10-CM | POA: Diagnosis not present

## 2022-01-22 DIAGNOSIS — N183 Chronic kidney disease, stage 3 unspecified: Secondary | ICD-10-CM | POA: Diagnosis not present

## 2022-01-22 DIAGNOSIS — Z7689 Persons encountering health services in other specified circumstances: Secondary | ICD-10-CM | POA: Diagnosis not present

## 2022-01-26 DIAGNOSIS — Z23 Encounter for immunization: Secondary | ICD-10-CM | POA: Diagnosis not present

## 2022-01-26 DIAGNOSIS — M722 Plantar fascial fibromatosis: Secondary | ICD-10-CM | POA: Diagnosis not present

## 2022-01-26 DIAGNOSIS — T466X5A Adverse effect of antihyperlipidemic and antiarteriosclerotic drugs, initial encounter: Secondary | ICD-10-CM | POA: Diagnosis not present

## 2022-01-26 DIAGNOSIS — M797 Fibromyalgia: Secondary | ICD-10-CM | POA: Diagnosis not present

## 2022-01-26 DIAGNOSIS — M791 Myalgia, unspecified site: Secondary | ICD-10-CM | POA: Diagnosis not present

## 2022-01-26 DIAGNOSIS — R42 Dizziness and giddiness: Secondary | ICD-10-CM | POA: Diagnosis not present

## 2022-01-26 DIAGNOSIS — F0390 Unspecified dementia without behavioral disturbance: Secondary | ICD-10-CM | POA: Diagnosis not present

## 2022-01-26 DIAGNOSIS — R7301 Impaired fasting glucose: Secondary | ICD-10-CM | POA: Diagnosis not present

## 2022-01-28 ENCOUNTER — Other Ambulatory Visit: Payer: Self-pay

## 2022-01-28 DIAGNOSIS — F419 Anxiety disorder, unspecified: Secondary | ICD-10-CM

## 2022-01-28 MED ORDER — ESCITALOPRAM OXALATE 5 MG PO TABS: 5.0000 mg | ORAL_TABLET | Freq: Every day | ORAL | 1 refills | Status: DC

## 2022-03-25 ENCOUNTER — Other Ambulatory Visit: Payer: Self-pay

## 2022-03-25 DIAGNOSIS — F419 Anxiety disorder, unspecified: Secondary | ICD-10-CM

## 2022-03-25 MED ORDER — ESCITALOPRAM OXALATE 5 MG PO TABS: 5.0000 mg | ORAL_TABLET | Freq: Every day | ORAL | 3 refills | Status: DC

## 2022-06-17 ENCOUNTER — Encounter: Payer: Self-pay | Admitting: Physician Assistant

## 2022-06-17 ENCOUNTER — Ambulatory Visit: Payer: PPO | Admitting: Physician Assistant

## 2022-06-17 VITALS — BP 126/84 | HR 78 | Resp 18 | Ht 65.0 in | Wt 127.0 lb

## 2022-06-17 DIAGNOSIS — R251 Tremor, unspecified: Secondary | ICD-10-CM

## 2022-06-17 DIAGNOSIS — M62838 Other muscle spasm: Secondary | ICD-10-CM | POA: Diagnosis not present

## 2022-06-17 DIAGNOSIS — F0670 Mild neurocognitive disorder due to known physiological condition without behavioral disturbance: Secondary | ICD-10-CM | POA: Diagnosis not present

## 2022-06-17 DIAGNOSIS — G309 Alzheimer's disease, unspecified: Secondary | ICD-10-CM

## 2022-06-17 DIAGNOSIS — F09 Unspecified mental disorder due to known physiological condition: Secondary | ICD-10-CM

## 2022-06-17 DIAGNOSIS — R413 Other amnesia: Secondary | ICD-10-CM | POA: Diagnosis not present

## 2022-06-17 MED ORDER — RIVASTIGMINE TARTRATE 4.5 MG PO CAPS: ORAL_CAPSULE | ORAL | 11 refills | Status: DC

## 2022-06-17 NOTE — Progress Notes (Incomplete)
Assessment/Plan:   Dementia likely due to Alzheimer's disease ***  Natalie Morrow is a very pleasant 71 y.o. LH female Neuropsychological testing on 10/28/2020 showed mild cognitive deficits in memory and executive function but did not meet criteria for dementia.  PET scan of brain on 03/10/2021 showed decreased relative cortical metabolism within the left parietal lobe as may be seen in Alzheimer's type pathology presenting today in follow-up for evaluation of memory loss. Most recent EEG was negative for seizure activity.  Patient is on rivastigmine 3 mg bid     Recommendations:   Follow up in 6  months. Continue rivastigmine 4.5 mg twice daily    Subjective:   This patient is accompanied in the office by her husband who supplements the history. Previous records as well as any outside records available were reviewed prior to todays visit.  Patient last seen on 12/15/21     Any changes in memory since last visit? Memory is worse, Her STM is not good. She has trouble putting sentences together. She still can remember names of family members . LTM is good.  She is able to do simple ADLs. repeats oneself?  Endorsed Disoriented when walking into a room?  Patient denies  Sometimes I go in a circle, wondering what I was supposed to do in a room. Leaving objects in unusual places?  Patient denies but does misplace things. Ambulates  with difficulty?   Patient denies   Recent falls?  Patient denies , but she is cautious to prevent falls   Any head injuries?  Patient denies   History of seizures?   Patient denies  *** Wandering behavior?  Patient denies   Patient drives? No longer drives  Any mood changes since last visit?  Patient denies. . Sometimes she ma yfeel down, but gets "back up with the help of my husband and son"   Any worsening depression?:  Patient denies   Hallucinations? Talks in her sleep but denies seeing anything Paranoia?  Patient denies   Patient reports that sleeps  well with sometimes vivid dreams "trying to find someone to give them something", sometimes she may be acting up, no other REM behavior or sleepwalking   History of sleep apnea?  Patient denies   Any hygiene concerns?  Patient denies   Independent of bathing and dressing?  Endorsed  Does the patient needs help with medications? Husband in charge, places in the pillbox for her Who is in charge of the finances?  Her husband is in charge    Any changes in appetite?  Patient denies   Patient have trouble swallowing? Patient denies   Does the patient cook?  Patient denies   Any kitchen accidents such as leaving the stove on? Patient denies   Any headaches?  Patient denies   Double vision? Patient denies   Any focal numbness or tingling?  Patient denies   Chronic back pain Patient denies   Unilateral weakness?  Patient denies   Any tremors?  She has a history of mild chronic hand tremors at night, now on the R arm  Any history of anosmia?  Patient denies   Any incontinence of urine?  Patient denies   Any bowel dysfunction?   Patient denies      Patient lives with her husband, who is sick, she is scared.     Initial evaluation 07/28/21  On the evening of 08/04/2020, she did not recognize her husband.  She wasn't agitated but didn't want to  sleep in the same bed and asked him to leave the house.  The next morning, she still didn't recognize her husband.  Her son came over and she did recognize her son.  They brought her to the ED.  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  CMP showed normal kidney and liver function with no electrolyte abnormalities.  TSH was 1.687.  WBC on CBC was normal.  UA was positive for UTI.  She was discharged on antibiotics.  B12, folate and RPR reportedly normal.  Over the next several weeks, symptoms slowly improved but have not completely resolved.  She will  still sometimes briefly not recognize her husband.  On one occasion, she did not really recognize her son either.  She has still been able to perform all ADLs however she has not been driving since this started.  No headache, fever, cough.  Prior to onset, she had been experiencing dizziness/vertigo.  Also, she appeared to not be as talkative on the phone.  She has been under increased stress caring for her husband who has been having worsening kidney disease and may need to start dialysis.  EEG on 09/25/2020 showed mild left temporal slowing.  She underwent neuropsychological testing on 10/28/2020 showed mild cognitive deficits in memory and executive function but did not meet criteria for dementia.  PET scan of brain on 03/10/2021 showed decreased relative cortical metabolism within the left parietal lobe as may be seen in Alzheimer's type pathology. Mother and maternal aunt dementia. Her sister has been experiencing symptoms as well.  Past Medical History:  Diagnosis Date   AK (actinic keratosis) 09/17/2015   Arthritis    Bulging lumbar disc    Bursitis of hip    Fibromyalgia    GERD (gastroesophageal reflux disease)    Hormone replacement therapy (HRT) 09/05/2013   Hypercholesteremia    Hypothyroidism    Plantar fasciitis, bilateral    Rosacea      Past Surgical History:  Procedure Laterality Date   BACK SURGERY  05/2015   CHOLECYSTECTOMY     COLONOSCOPY N/A 11/10/2012   Procedure: COLONOSCOPY;  Surgeon: Rogene Houston, MD;  Location: [facility];  Service: Endoscopy;  Laterality: N/A;  730   COLONOSCOPY WITH PROPOFOL N/A 04/08/2018   Procedure: COLONOSCOPY WITH PROPOFOL;  Surgeon: Rogene Houston, MD;  Location: [facility];  Service: Endoscopy;  Laterality: N/A;  100   TONSILLECTOMY       PREVIOUS MEDICATIONS:   CURRENT MEDICATIONS:  Outpatient Encounter Medications as of 06/17/2022  Medication Sig   acetaminophen (TYLENOL) 500 MG tablet Take 500 mg by mouth as needed for mild  pain.    cholecalciferol (VITAMIN D3) 25 MCG (1000 UNIT) tablet Take 1,000 Units by mouth daily.   escitalopram (LEXAPRO) 5 MG tablet Take 1 tablet (5 mg total) by mouth daily.   Famotidine (PEPCID AC PO) Take by mouth daily.   levothyroxine (SYNTHROID, LEVOTHROID) 50 MCG tablet Take 50 mcg by mouth daily.   loratadine (CLARITIN) 10 MG tablet Take 10 mg by mouth daily as needed for allergies.   Probiotic Product (ALIGN) 4 MG CAPS Take 4 mg by mouth 2 (two) times a week.   rivastigmine (EXELON) 3 MG capsule Take 1 capsule at night for 1 week, then increase to 1 capsule twice daily   No facility-administered encounter medications on file as of 06/17/2022.  Objective:     PHYSICAL EXAMINATION:    VITALS:  There were no vitals filed for this visit.  GEN:  The patient appears stated age and is in NAD. HEENT:  Normocephalic, atraumatic.   Neurological examination:  General: NAD, well-groomed, appears stated age. Orientation: The patient is alert. Oriented to person, place and date Cranial nerves: There is good facial symmetry.The speech is fluent and clear. No aphasia or dysarthria. Fund of knowledge is reduced. Recent and remote memory are impaired.  Attention and concentration are reduced.  Able to name objects and repeat phrases.  Hearing is intact to conversational tone.    Sensation: Sensation is intact to light touch throughout Motor: Strength is at least antigravity x4. Tremors: Slight postural and kinetic tremor in her hands. DTR's 2/4 in UE/LE       No data to display             09/19/2021    8:00 AM 10/28/2020   10:00 AM  MMSE - Mini Mental State Exam  Orientation to time 3 5  Orientation to Place 5 5  Registration 3 3  Attention/ Calculation 2 3  Recall 2 0  Language- name 2 objects 2 2  Language- repeat 1 1  Language- follow 3 step command 3 1  Language- read & follow direction 1 1  Write a sentence 0 1  Copy design 0 0  Total score 22 22        Movement examination: Tone: There is normal tone in the UE/LE Abnormal movements:  no tremor.  No myoclonus.  No asterixis.   Coordination:  There is no decremation with RAM's. Normal finger to nose  Gait and Station: The patient has no difficulty arising out of a deep-seated chair without the use of the hands. The patient's stride length is good.  Gait is cautious and narrow.   Thank you for allowing [place] the opportunity to participate in the care of this nice patient. Please do not hesitate to contact us for any questions or concerns.   Total time spent on today's visit was *** minutes dedicated to this patient today, preparing to see patient, examining the patient, ordering tests and/or medications and counseling the patient, documenting clinical information in the EHR or other health record, independently interpreting results and communicating results to the patient/family, discussing treatment and goals, answering patient's questions and coordinating care.  Cc:  Estanislado Pandy, MD  [name] 06/17/2022 7:25 AM

## 2022-06-17 NOTE — Patient Instructions (Addendum)
It was a pleasure to see you today at our office.   Recommendations:  Follow up in  6 months Increase rivastigmine  4.5 mg twice a day   Repeat the MRI brain  Repeat Neurocognitive testing for clarity of the diagnosis COntinue B12 supplements   Whom to call:  Memory  decline, memory medications: Call our office [phone]   For psychiatric meds, mood meds: Please have your primary care physician manage these medications.   Counseling regarding caregiver distress, including caregiver depression, anxiety and issues regarding community resources, adult day care programs, adult living facilities, or memory care questions:   Feel free to contact [place], Social Worker at [phone]   For assessment of decision of mental capacity and competency:  Call Dr. Anthoney Harada, geriatric psychiatrist at [phone]  For guidance in geriatric dementia issues please call [facility] [phone]  For guidance regarding [facility] and if placement were needed at the facility, contact Arnell Asal, Social Worker tel: [phone]  If you have any severe symptoms of a stroke, or other severe issues such as confusion,severe chills or fever, etc call 911 or go to the ER as you may need to be evaluated further   Feel free to visit Facebook page " [email or web address]" for tips of how to care for people with memory problems.       RECOMMENDATIONS FOR ALL PATIENTS WITH MEMORY PROBLEMS: 1. Continue to exercise (Recommend 30 minutes of walking everyday, or 3 hours every week) 2. Increase social interactions - continue going to [place] and enjoy social gatherings with friends and family 3. Eat healthy, avoid fried foods and eat more fruits and vegetables 4. Maintain adequate blood pressure, blood sugar, and blood cholesterol level. Reducing the risk of stroke and cardiovascular disease also helps promoting better memory. 5. Avoid stressful situations. Live a simple  life and avoid aggravations. Organize your time and prepare for the next day in anticipation. 6. Sleep well, avoid any interruptions of sleep and avoid any distractions in the bedroom that may interfere with adequate sleep quality 7. Avoid sugar, avoid sweets as there is a strong link between excessive sugar intake, diabetes, and cognitive impairment We discussed the Mediterranean diet, which has been shown to help patients reduce the risk of progressive memory disorders and reduces cardiovascular risk. This includes eating fish, eat fruits and green leafy vegetables, nuts like almonds and hazelnuts, walnuts, and also use olive oil. Avoid fast foods and fried foods as much as possible. Avoid sweets and sugar as sugar use has been linked to worsening of memory function.  There is always a concern of gradual progression of memory problems. If this is the case, then we may need to adjust level of care according to patient needs. Support, both to the patient and caregiver, should then be put into place.    The [employer] Association is here all day, every day for people facing Alzheimer's disease through our free 24/7 Helpline: 5072122384. The Helpline provides reliable information and support to all those who need assistance, such as individuals living with memory loss, Alzheimer's or other dementia, caregivers, health care professionals and the public.  Our highly trained and knowledgeable staff can help you with: Understanding memory loss, dementia and Alzheimer's  Medications and other treatment options  General information about aging and brain health  Skills to provide quality care and to find the best care from professionals  Legal, financial and living-arrangement decisions Our Helpline also features: Confidential care consultation  provided by master's level clinicians who can help with decision-making support, crisis assistance and education on issues families face every day  Help in a  caller's preferred language using our translation service that features more than 200 languages and dialects  Referrals to local community programs, services and ongoing support     FALL PRECAUTIONS: Be cautious when walking. Scan the area for obstacles that may increase the risk of trips and falls. When getting up in the mornings, sit up at the edge of the bed for a few minutes before getting out of bed. Consider elevating the bed at the head end to avoid drop of blood pressure when getting up. Walk always in a well-lit room (use night lights in the walls). Avoid area rugs or power cords from appliances in the middle of the walkways. Use a walker or a cane if necessary and consider physical therapy for balance exercise. Get your eyesight checked regularly.  FINANCIAL OVERSIGHT: Supervision, especially oversight when making financial decisions or transactions is also recommended.  HOME SAFETY: Consider the safety of the kitchen when operating appliances like stoves, microwave oven, and blender. Consider having supervision and share cooking responsibilities until no longer able to participate in those. Accidents with firearms and other hazards in the house should be identified and addressed as well.   ABILITY TO BE LEFT ALONE: If patient is unable to contact 911 operator, consider using LifeLine, or when the need is there, arrange for someone to stay with patients. Smoking is a fire hazard, consider supervision or cessation. Risk of wandering should be assessed by caregiver and if detected at any point, supervision and safe proof recommendations should be instituted.  MEDICATION SUPERVISION: Inability to self-administer medication needs to be constantly addressed. Implement a mechanism to ensure safe administration of the medications.   DRIVING: Regarding driving, in patients with progressive memory problems, driving will be impaired. We advise to have someone else do the driving if trouble finding  directions or if minor accidents are reported. Independent driving assessment is available to determine safety of driving.   If you are interested in the driving assessment, you can contact the following:  [employer] in [place]  [place] [phone] or [phone]      [place] refers to food and lifestyle choices that are based on the traditions of countries located on the [employer]. This way of eating has been shown to help prevent certain conditions and improve outcomes for people who have chronic diseases, like kidney disease and heart disease. What are tips for following this plan? Lifestyle  Cook and eat meals together with your family, when possible. Drink enough fluid to keep your urine clear or pale yellow. Be physically active every day. This includes: Aerobic exercise like running or swimming. Leisure activities like gardening, walking, or housework. Get 7-8 hours of sleep each night. If recommended by your health care provider, drink red wine in moderation. This means 1 glass a day for nonpregnant women and 2 glasses a day for men. A glass of wine equals 5 oz (150 mL). Reading food labels  Check the serving size of packaged foods. For foods such as rice and pasta, the serving size refers to the amount of cooked product, not dry. Check the total fat in packaged foods. Avoid foods that have saturated fat or trans fats. Check the ingredients list for added sugars, such as corn syrup.  Shopping  At the grocery store, buy most of your food from the areas near the walls of the store. This includes: Fresh fruits and vegetables (produce). Grains, beans, nuts, and seeds. Some of these may be available in unpackaged forms or large amounts (in bulk). Fresh seafood. Poultry and eggs. Low-fat dairy products. Buy whole  ingredients instead of prepackaged foods. Buy fresh fruits and vegetables in-season from local farmers markets. Buy frozen fruits and vegetables in resealable bags. If you do not have access to quality fresh seafood, buy precooked frozen shrimp or canned fish, such as tuna, salmon, or sardines. Buy small amounts of raw or cooked vegetables, salads, or olives from the deli or salad bar at your store. Stock your pantry so you always have certain foods on hand, such as olive oil, canned tuna, canned tomatoes, rice, pasta, and beans. Cooking  Cook foods with extra-virgin olive oil instead of using butter or other vegetable oils. Have meat as a side dish, and have vegetables or grains as your main dish. This means having meat in small portions or adding small amounts of meat to foods like pasta or stew. Use beans or vegetables instead of meat in common dishes like chili or lasagna. Experiment with different cooking methods. Try roasting or broiling vegetables instead of steaming or sauteing them. Add frozen vegetables to soups, stews, pasta, or rice. Add nuts or seeds for added healthy fat at each meal. You can add these to yogurt, salads, or vegetable dishes. Marinate fish or vegetables using olive oil, lemon juice, garlic, and fresh herbs. Meal planning  Plan to eat 1 vegetarian meal one day each week. Try to work up to 2 vegetarian meals, if possible. Eat seafood 2 or more times a week. Have healthy snacks readily available, such as: Vegetable sticks with hummus. Greek yogurt. Fruit and nut trail mix. Eat balanced meals throughout the week. This includes: Fruit: 2-3 servings a day Vegetables: 4-5 servings a day Low-fat dairy: 2 servings a day Fish, poultry, or lean meat: 1 serving a day Beans and legumes: 2 or more servings a week Nuts and seeds: 1-2 servings a day Whole grains: 6-8 servings a day Extra-virgin olive oil: 3-4 servings a day Limit red meat and sweets to only a few servings  a month What are my food choices? Mediterranean diet Recommended Grains: Whole-grain pasta. Brown rice. Bulgar wheat. Polenta. Couscous. Whole-wheat bread. [place]. Vegetables: Artichokes. Beets. Broccoli. Cabbage. Carrots. Eggplant. Green beans. Chard. Kale. Spinach. Onions. Leeks. Peas. Squash. Tomatoes. Peppers. Radishes. Fruits: Apples. Apricots. Avocado. Berries. Bananas. Cherries. Dates. Figs. Grapes. Lemons. Melon. Oranges. Peaches. Plums. Pomegranate. Meats and other protein foods: Beans. Almonds. Sunflower seeds. Pine nuts. Peanuts. [place]. Salmon. Scallops. Shrimp. [place]. Tilapia. Clams. Oysters. Eggs. Dairy: Low-fat milk. Cheese. Greek yogurt. Beverages: Water. Red wine. Herbal tea. Fats and oils: Extra virgin olive oil. Avocado oil. Grape seed oil. Sweets and desserts: [place] yogurt with honey. Baked apples. Poached pears. Trail mix. Seasoning and other foods: Basil. Cilantro. Coriander. Cumin. Mint. Parsley. Sage. Rosemary. Tarragon. Garlic. Oregano. Thyme. Pepper. Balsalmic vinegar. Tahini. Hummus. Tomato sauce. Olives. Mushrooms. Limit these Grains: Prepackaged pasta or rice dishes. Prepackaged cereal with added sugar. Vegetables: Deep fried potatoes (french fries). Fruits: Fruit canned in syrup. Meats and other protein foods: Beef. Pork. Lamb. Poultry with skin. Hot dogs. [place] Salines. Dairy: Ice cream. Sour cream. Whole milk. Beverages: Juice. Sugar-sweetened soft drinks. Beer. Liquor and spirits. Fats and oils: Butter. Canola oil. Vegetable oil. Beef fat (tallow). Lard. Sweets and  desserts: Cookies. Cakes. Pies. Candy. Seasoning and other foods: Mayonnaise. Premade sauces and marinades. The items listed may not be a complete list. Talk with your dietitian about what dietary choices are right for you. Summary The Mediterranean diet includes both food and lifestyle choices. Eat a variety of fresh fruits and vegetables, beans, nuts, seeds, and whole grains. Limit the amount of  red meat and sweets that you eat. Talk with your health care provider about whether it is safe for you to drink red wine in moderation. This means 1 glass a day for nonpregnant women and 2 glasses a day for men. A glass of wine equals 5 oz (150 mL). This information is not intended to replace advice given to you by your health care provider. Make sure you discuss any questions you have with your health care provider. Document Released: 04/23/2016 Document Revised: 05/26/2016 Document Reviewed: 04/23/2016 Elsevier Interactive Patient Education  2017 [employer].    3

## 2022-06-18 DIAGNOSIS — F067 Alzheimer's disease, unspecified: Secondary | ICD-10-CM | POA: Insufficient documentation

## 2022-06-26 ENCOUNTER — Other Ambulatory Visit: Payer: Self-pay

## 2022-06-26 DIAGNOSIS — F419 Anxiety disorder, unspecified: Secondary | ICD-10-CM

## 2022-06-28 MED ORDER — ESCITALOPRAM OXALATE 5 MG PO TABS: 5.0000 mg | ORAL_TABLET | Freq: Every day | ORAL | 3 refills | Status: DC

## 2022-07-01 ENCOUNTER — Other Ambulatory Visit: Payer: PPO

## 2022-07-15 ENCOUNTER — Ambulatory Visit
Admission: RE | Admit: 2022-07-15 | Discharge: 2022-07-15 | Disposition: A | Discharge status: Home / Self Care | Payer: PPO | Source: Ambulatory Visit | Attending: Physician Assistant | Admitting: Physician Assistant

## 2022-07-15 DIAGNOSIS — R413 Other amnesia: Secondary | ICD-10-CM | POA: Diagnosis not present

## 2022-07-15 DIAGNOSIS — G319 Degenerative disease of nervous system, unspecified: Secondary | ICD-10-CM | POA: Diagnosis not present

## 2022-07-15 DIAGNOSIS — F09 Unspecified mental disorder due to known physiological condition: Secondary | ICD-10-CM

## 2022-07-15 DIAGNOSIS — R262 Difficulty in walking, not elsewhere classified: Secondary | ICD-10-CM | POA: Diagnosis not present

## 2022-07-15 DIAGNOSIS — R479 Unspecified speech disturbances: Secondary | ICD-10-CM | POA: Diagnosis not present

## 2022-07-17 NOTE — Progress Notes (Signed)
MRI brain shows some age related changes, mild atrophy in the brain but stable from prior, no new findings, thanks

## 2022-07-24 DIAGNOSIS — R7301 Impaired fasting glucose: Secondary | ICD-10-CM | POA: Diagnosis not present

## 2022-07-24 DIAGNOSIS — E039 Hypothyroidism, unspecified: Secondary | ICD-10-CM | POA: Diagnosis not present

## 2022-07-24 DIAGNOSIS — N183 Chronic kidney disease, stage 3 unspecified: Secondary | ICD-10-CM | POA: Diagnosis not present

## 2022-07-24 DIAGNOSIS — E7849 Other hyperlipidemia: Secondary | ICD-10-CM | POA: Diagnosis not present

## 2022-07-29 DIAGNOSIS — R7301 Impaired fasting glucose: Secondary | ICD-10-CM | POA: Diagnosis not present

## 2022-07-29 DIAGNOSIS — M791 Myalgia, unspecified site: Secondary | ICD-10-CM | POA: Diagnosis not present

## 2022-07-29 DIAGNOSIS — F0390 Unspecified dementia without behavioral disturbance: Secondary | ICD-10-CM | POA: Diagnosis not present

## 2022-07-29 DIAGNOSIS — E038 Other specified hypothyroidism: Secondary | ICD-10-CM | POA: Diagnosis not present

## 2022-07-29 DIAGNOSIS — R42 Dizziness and giddiness: Secondary | ICD-10-CM | POA: Diagnosis not present

## 2022-07-29 DIAGNOSIS — E7849 Other hyperlipidemia: Secondary | ICD-10-CM | POA: Diagnosis not present

## 2022-07-29 DIAGNOSIS — T466X5A Adverse effect of antihyperlipidemic and antiarteriosclerotic drugs, initial encounter: Secondary | ICD-10-CM | POA: Diagnosis not present

## 2022-07-29 DIAGNOSIS — Z1331 Encounter for screening for depression: Secondary | ICD-10-CM | POA: Diagnosis not present

## 2022-07-29 DIAGNOSIS — Z1389 Encounter for screening for other disorder: Secondary | ICD-10-CM | POA: Diagnosis not present

## 2022-07-29 DIAGNOSIS — Z0001 Encounter for general adult medical examination with abnormal findings: Secondary | ICD-10-CM | POA: Diagnosis not present

## 2022-07-29 DIAGNOSIS — N1831 Chronic kidney disease, stage 3a: Secondary | ICD-10-CM | POA: Diagnosis not present

## 2022-07-29 DIAGNOSIS — Z23 Encounter for immunization: Secondary | ICD-10-CM | POA: Diagnosis not present

## 2022-08-04 ENCOUNTER — Ambulatory Visit: Payer: PPO | Admitting: Physician Assistant

## 2022-08-28 ENCOUNTER — Ambulatory Visit: Payer: PPO | Admitting: Physician Assistant

## 2022-08-28 VITALS — HR 87 | Resp 20 | Ht 65.0 in | Wt 124.0 lb

## 2022-08-28 DIAGNOSIS — R413 Other amnesia: Secondary | ICD-10-CM

## 2022-08-28 DIAGNOSIS — R251 Tremor, unspecified: Secondary | ICD-10-CM

## 2022-08-28 NOTE — Patient Instructions (Addendum)
It was a pleasure to see you today at our office.   Recommendations:  Follow up in jan 17 at 1 pm   Hold rivastigmine slowly, first to once a day for 1 week and then off for another week and see if the tremor dissappear  DAT scan  PT home and ST  Repeat Neurocognitive testing for clarity of the diagnosis Slowly discontinue Lexapro weaning it off Continue B12 supplements   Whom to call:  Memory  decline, memory medications: Call our office [phone]   For psychiatric meds, mood meds: Please have your primary care physician manage these medications.      For assessment of decision of mental capacity and competency:  Call Dr. Erick Blinks, geriatric psychiatrist at [phone]  For guidance in geriatric dementia issues please call [facility] [phone]   If you have any severe symptoms of a stroke, or other severe issues such as confusion,severe chills or fever, etc call 911 or go to the ER as you may need to be evaluated further   Feel free to visit Facebook page " [email or web address]" for tips of how to care for people with memory problems.       RECOMMENDATIONS FOR ALL PATIENTS WITH MEMORY PROBLEMS: 1. Continue to exercise (Recommend 30 minutes of walking everyday, or 3 hours every week) 2. Increase social interactions - continue going to [place] and enjoy social gatherings with friends and family 3. Eat healthy, avoid fried foods and eat more fruits and vegetables 4. Maintain adequate blood pressure, blood sugar, and blood cholesterol level. Reducing the risk of stroke and cardiovascular disease also helps promoting better memory. 5. Avoid stressful situations. Live a simple life and avoid aggravations. Organize your time and prepare for the next day in anticipation. 6. Sleep well, avoid any interruptions of sleep and avoid any distractions in the bedroom that may interfere with adequate sleep quality 7. Avoid sugar, avoid sweets as there is a strong link between  excessive sugar intake, diabetes, and cognitive impairment We discussed the Mediterranean diet, which has been shown to help patients reduce the risk of progressive memory disorders and reduces cardiovascular risk. This includes eating fish, eat fruits and green leafy vegetables, nuts like almonds and hazelnuts, walnuts, and also use olive oil. Avoid fast foods and fried foods as much as possible. Avoid sweets and sugar as sugar use has been linked to worsening of memory function.  There is always a concern of gradual progression of memory problems. If this is the case, then we may need to adjust level of care according to patient needs. Support, both to the patient and caregiver, should then be put into place.    The Alzheimer's Association is here all day, every day for people facing Alzheimer's disease through our free 24/7 Helpline: [phone]. The Helpline provides reliable information and support to all those who need assistance, such as individuals living with memory loss, Alzheimer's or other dementia, caregivers, health care professionals and the public.  Our highly trained and knowledgeable staff can help you with: Understanding memory loss, dementia and Alzheimer's  Medications and other treatment options  General information about aging and brain health  Skills to provide quality care and to find the best care from professionals  Legal, financial and living-arrangement decisions Our Helpline also features: Confidential care consultation provided by master's level clinicians who can help with decision-making support, crisis assistance and education on issues families face every day  Help in a caller's preferred language using our translation  service that features more than 200 languages and dialects  Referrals to local community programs, services and ongoing support     FALL PRECAUTIONS: Be cautious when walking. Scan the area for obstacles that may increase the risk of trips and  falls. When getting up in the mornings, sit up at the edge of the bed for a few minutes before getting out of bed. Consider elevating the bed at the head end to avoid drop of blood pressure when getting up. Walk always in a well-lit room (use night lights in the walls). Avoid area rugs or power cords from appliances in the middle of the walkways. Use a walker or a cane if necessary and consider physical therapy for balance exercise. Get your eyesight checked regularly.  FINANCIAL OVERSIGHT: Supervision, especially oversight when making financial decisions or transactions is also recommended.  HOME SAFETY: Consider the safety of the kitchen when operating appliances like stoves, microwave oven, and blender. Consider having supervision and share cooking responsibilities until no longer able to participate in those. Accidents with firearms and other hazards in the house should be identified and addressed as well.   ABILITY TO BE LEFT ALONE: If patient is unable to contact 911 operator, consider using LifeLine, or when the need is there, arrange for someone to stay with patients. Smoking is a fire hazard, consider supervision or cessation. Risk of wandering should be assessed by caregiver and if detected at any point, supervision and safe proof recommendations should be instituted.  MEDICATION SUPERVISION: Inability to self-administer medication needs to be constantly addressed. Implement a mechanism to ensure safe administration of the medications.   DRIVING: Regarding driving, in patients with progressive memory problems, driving will be impaired. We advise to have someone else do the driving if trouble finding directions or if minor accidents are reported. Independent driving assessment is available to determine safety of driving.   If you are interested in the driving assessment, you can contact the following:  [employer] in [place] [phone]  [facility]  [phone]  [facility] [phone] or [phone]      Mediterranean Diet A Mediterranean diet refers to food and lifestyle choices that are based on the traditions of countries located on the [employer] [place]. This way of eating has been shown to help prevent certain conditions and improve outcomes for people who have chronic diseases, like kidney disease and heart disease. What are tips for following this plan? Lifestyle  Cook and eat meals together with your family, when possible. Drink enough fluid to keep your urine clear or pale yellow. Be physically active every day. This includes: Aerobic exercise like running or swimming. Leisure activities like gardening, walking, or housework. Get 7-8 hours of sleep each night. If recommended by your health care provider, drink red wine in moderation. This means 1 glass a day for nonpregnant women and 2 glasses a day for men. A glass of wine equals 5 oz (150 mL). Reading food labels  Check the serving size of packaged foods. For foods such as rice and pasta, the serving size refers to the amount of cooked product, not dry. Check the total fat in packaged foods. Avoid foods that have saturated fat or trans fats. Check the ingredients list for added sugars, such as corn syrup. Shopping  At the grocery store, buy most of your food from the areas near the walls of the store. This includes: Fresh fruits and vegetables (produce). Grains, beans, nuts, and  seeds. Some of these may be available in unpackaged forms or large amounts (in bulk). Fresh seafood. Poultry and eggs. Low-fat dairy products. Buy whole ingredients instead of prepackaged foods. Buy fresh fruits and vegetables in-season from local farmers markets. Buy frozen fruits and vegetables in resealable bags. If you do not have access to quality fresh seafood, buy precooked frozen shrimp or canned fish, such as tuna, salmon, or sardines. Buy  small amounts of raw or cooked vegetables, salads, or olives from the deli or salad bar at your store. Stock your pantry so you always have certain foods on hand, such as olive oil, canned tuna, canned tomatoes, rice, pasta, and beans. Cooking  Cook foods with extra-virgin olive oil instead of using butter or other vegetable oils. Have meat as a side dish, and have vegetables or grains as your main dish. This means having meat in small portions or adding small amounts of meat to foods like pasta or stew. Use beans or vegetables instead of meat in common dishes like chili or lasagna. Experiment with different cooking methods. Try roasting or broiling vegetables instead of steaming or sauteing them. Add frozen vegetables to soups, stews, pasta, or rice. Add nuts or seeds for added healthy fat at each meal. You can add these to yogurt, salads, or vegetable dishes. Marinate fish or vegetables using olive oil, lemon juice, garlic, and fresh herbs. Meal planning  Plan to eat 1 vegetarian meal one day each week. Try to work up to 2 vegetarian meals, if possible. Eat seafood 2 or more times a week. Have healthy snacks readily available, such as: Vegetable sticks with hummus. Greek yogurt. Fruit and nut trail mix. Eat balanced meals throughout the week. This includes: Fruit: 2-3 servings a day Vegetables: 4-5 servings a day Low-fat dairy: 2 servings a day Fish, poultry, or lean meat: 1 serving a day Beans and legumes: 2 or more servings a week Nuts and seeds: 1-2 servings a day Whole grains: 6-8 servings a day Extra-virgin olive oil: 3-4 servings a day Limit red meat and sweets to only a few servings a month What are my food choices? Mediterranean diet Recommended Grains: Whole-grain pasta. Brown rice. Bulgar wheat. Polenta. Couscous. Whole-wheat bread. Orpah Cobb. Vegetables: Artichokes. Beets. Broccoli. Cabbage. Carrots. Eggplant. Green beans. Chard. Kale. Spinach. Onions. Leeks. Peas.  Squash. Tomatoes. Peppers. Radishes. Fruits: Apples. Apricots. Avocado. Berries. Bananas. Cherries. Dates. Figs. Grapes. Lemons. Melon. Oranges. Peaches. Plums. Pomegranate. Meats and other protein foods: Beans. Almonds. Sunflower seeds. Pine nuts. Peanuts. Cod. Salmon. Scallops. Shrimp. Tuna. Tilapia. Clams. Oysters. Eggs. Dairy: Low-fat milk. Cheese. Greek yogurt. Beverages: Water. Red wine. Herbal tea. Fats and oils: Extra virgin olive oil. Avocado oil. Grape seed oil. Sweets and desserts: [place] yogurt with honey. Baked apples. Poached pears. Trail mix. Seasoning and other foods: Basil. Cilantro. Coriander. Cumin. Mint. Parsley. Sage. Rosemary. Tarragon. Garlic. Oregano. Thyme. Pepper. Balsalmic vinegar. Tahini. Hummus. Tomato sauce. Olives. Mushrooms. Limit these Grains: Prepackaged pasta or rice dishes. Prepackaged cereal with added sugar. Vegetables: Deep fried potatoes (french fries). Fruits: Fruit canned in syrup. Meats and other protein foods: Beef. Pork. Lamb. Poultry with skin. Hot dogs. Tomasa Blase. Dairy: Ice cream. Sour cream. Whole milk. Beverages: Juice. Sugar-sweetened soft drinks. Beer. Liquor and spirits. Fats and oils: Butter. Canola oil. Vegetable oil. Beef fat (tallow). Lard. Sweets and desserts: Cookies. Cakes. Pies. Candy. Seasoning and other foods: Mayonnaise. Premade sauces and marinades. The items listed may not be a complete list. Talk with your dietitian about what dietary choices  are right for you. Summary The Mediterranean diet includes both food and lifestyle choices. Eat a variety of fresh fruits and vegetables, beans, nuts, seeds, and whole grains. Limit the amount of red meat and sweets that you eat. Talk with your health care provider about whether it is safe for you to drink red wine in moderation. This means 1 glass a day for nonpregnant women and 2 glasses a day for men. A glass of wine equals 5 oz (150 mL). This information is not intended to replace advice  given to you by your health care provider. Make sure you discuss any questions you have with your health care provider. Document Released: 04/23/2016 Document Revised: 05/26/2016 Document Reviewed: 04/23/2016 Elsevier Interactive Patient Education  2017 [employer]    3

## 2022-08-28 NOTE — Progress Notes (Signed)
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 RH female seen today in follow up for memory loss. Neuropsychological testing on 10/28/2020 showed mild cognitive deficits in memory and executive function but did not meet criteria for dementia.  PET scan of brain on 03/10/2021 showed decreased relative cortical metabolism within the left parietal lobe as may be seen in Alzheimer's type pathology. Patient is currently on rivastigmine 4.5 mg bid, and reports that this meds may be causing  worsening of her tremors, which could be a temporary side effects on patients with PD, which needs to be confirmed.  Most recent EEG was negative for seizure activity. She does demonstrate some parkinsonian features during exam, such as hypomimia, R postural and intention tremor, shorter stride, gait is normal. She also appears to have some involuntary R arm spasmodic movements, mild. Latest MRI brain, personally reviewed is remarkable for minimal chronic small vessel disease ( similar to prior MRI 02/02/21), stable mild generalized atrophy  Follow up in 1 month Check DAT scan for possible PD Recommend ST and Home PT for balance, strength and speech.  Continue B12 supplements Hold rivastigmine slowly weaning it off on a 2 week period , and monitor for improvement of symptoms Repeat neuropsychological evaluation for clarity of the diagnosis Patient wishes to discontinue, will wean it off as well.     Subjective:    This patient is accompanied in the office by her husband who supplements the history.  Previous records as well as any outside records available were reviewed prior to todays visit. She was last seen on Oct 12/2021   Any changes in memory since last visit? "Her STM is not good according to her. She has trouble putting sentences together"."Trying to convey what I want to say". She still can remember  names of family members . LTM is good.  She is able to do simple ADLs such as cleaning.. repeats oneself?  Endorsed Disoriented when walking into a room? " Sometimes I go in a circle, wondering what I was supposed to do in a room". Leaving objects in unusual places?  Patient denies but does misplace things. Ambulates  with difficulty?  She reports she has "weak legs, slow gait, unbalanced" she does not like the idea of a cane or walker.  Recent falls?  Patient denies , but she is cautious to prevent falls   Any head injuries?  Patient denies   Wandering behavior?  Patient denies   Patient drives? No longer drives  Any mood changes since last visit?     Sometimes she  "may feel down, but "will get back up with the help of my husband and son"   Any worsening depression?: She is taking Lexapro but does not think that she needs it.  Hallucinations? " Talks in her sleep but denies seeing anything" Paranoia?  Patient denies   Patient reports "tired and sleepy all the time" sometimes with vivid dreams "trying to find someone to give them something", sometimes she may be acting up, no other REM behavior or sleepwalking   History of sleep apnea?  Patient denies   Any hygiene concerns?  Patient denies   Independent of bathing and dressing?  Endorsed  Does the patient needs help with medications? Husband in charge, places in the pillbox for her Who is in charge of the finances?  Her husband is in charge  Any changes in appetite?  Patient reports decreased appetite recently, with some weight loss Patient have trouble swallowing? Patient denies  but husband reports increase salivation   Does the patient cook?  Patient denies   Any kitchen accidents such as leaving the stove on? Patient denies   Any headaches?  Patient denies   Double vision? Patient denies   Any focal numbness or tingling?  Patient denies   Chronic back pain Patient denies   Unilateral weakness?  Patient denies   Any tremors?  She  has a history of mild chronic hand tremors at night, now on the R arm with some involuntary spasmodic movements. No issues when holding objects. No issues with writing ("because I am L handed" ), but does report that these tremors may be worse than before, and she is curious if rivastigmine may cause her tremors to worsen. Any history of anosmia?  Patient denies   Any incontinence of urine?  Patient denies   Any bowel dysfunction?   Patient denies      Patient lives with her husband        Initial evaluation 07/28/21  On the evening of 08/04/2020, she did not recognize her husband.  She wasn't agitated but didn't want to sleep in the same bed and asked him to leave the house.  The next morning, she still didn't recognize her husband.  Her son came over and she did recognize her son.  They brought her to the ED.  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  CMP showed normal kidney and liver function with no electrolyte abnormalities.  TSH was 1.687.  WBC on CBC was normal.  UA was positive for UTI.  She was discharged on antibiotics.  B12, folate and RPR reportedly normal.  Over the next several weeks, symptoms slowly improved but have not completely resolved.  She will still sometimes briefly not recognize her husband.  On one occasion, she did not really recognize her son either.  She has still been able to perform all ADLs however she has not been driving since this started.  No headache, fever, cough.  Prior to onset, she had been experiencing dizziness/vertigo.  Also, she appeared to not be as talkative on the phone.  She has been under increased stress caring for her husband who has been having worsening kidney disease and may need to start dialysis.  EEG on 09/25/2020 showed mild left temporal slowing.  She underwent neuropsychological testing on 10/28/2020 showed mild cognitive deficits in memory  and executive function but did not meet criteria for dementia.  PET scan of brain on 03/10/2021 showed decreased relative cortical metabolism within the left parietal lobe as may be seen in Alzheimer's type pathology. Mother and maternal aunt dementia. Her sister has been experiencing symptoms as well.   PREVIOUS MEDICATIONS: donepezil "very vivid dreams", discontinued on 07/2021  CURRENT MEDICATIONS:  Outpatient Encounter Medications as of 08/28/2022  Medication Sig   acetaminophen (TYLENOL) 500 MG tablet Take 500 mg by mouth as needed for mild pain.    cholecalciferol (VITAMIN D3) 25 MCG (1000 UNIT) tablet Take 1,000 Units by mouth daily.   escitalopram (LEXAPRO) 5 MG tablet Take 1 tablet (5 mg total) by mouth daily.   Famotidine (PEPCID AC PO) Take by mouth daily.   levothyroxine (SYNTHROID, LEVOTHROID) 50 MCG tablet Take 50 mcg by mouth daily.   loratadine (CLARITIN) 10 MG  tablet Take 10 mg by mouth daily as needed for allergies.   rivastigmine (EXELON) 4.5 MG capsule Take 1 capsule at night for 1 week, then increase to 1 capsule twice daily   Probiotic Product (ALIGN) 4 MG CAPS Take 4 mg by mouth 2 (two) times a week.   No facility-administered encounter medications on file as of 08/28/2022.       06/24/2022   11:46 AM 09/19/2021    8:00 AM 10/28/2020   10:00 AM  MMSE - Mini Mental State Exam  Orientation to time 2 3 5   Orientation to Place 3 5 5   Registration 3 3 3   Attention/ Calculation 5 2 3   Recall 2 2 0  Language- name 2 objects 2 2 2   Language- repeat 1 1 1   Language- follow 3 step command 2 3 1   Language- read & follow direction 1 1 1   Write a sentence 0 0 1  Copy design 0 0 0  Total score 21 22 22        No data to display          Objective:     PHYSICAL EXAMINATION:    VITALS:   Vitals:   08/28/22 1427  Pulse: 87  Resp: 20  SpO2: 96%  Weight: 124 lb (56.2 kg)  Height: 5\' 5"  (1.651 m)    GEN:  The patient appears stated age and is in NAD. HEENT:   Normocephalic, atraumatic.   Neurological examination:  General: NAD, well-groomed, appears stated age. Orientation: The patient is alert. Oriented to person, place and date Cranial nerves: There is good facial symmetry. Mild hypomimia. The speech is hesitant at times, and clear. No aphasia or dysarthria. Fund of knowledge is appropriate. Recent and remote memory are impaired. Attention and concentration are reduced.  Able to name objects and repeat phrases.  Hearing is intact to conversational tone.    Sensation: Sensation is intact to light touch throughout Motor: Strength is at least antigravity x4. DTR's 2/4 in UE/LE     Movement examination: Tone: There is normal tone in the UE/LE. No cogwheeling.  Abnormal movements:  R postural and intention tremor  , involuntary, occasional mild spasmodic R arm movements . No asterixis.   Coordination:  There is some decremation with RAM's. Normal finger to nose  Gait and Station: The patient has no difficulty arising out of a deep-seated chair without the use of the hands. The patient's stride length is shorter than prior.  Gait is cautious and narrow.    Thank you for allowing the opportunity to participate in the care of this nice patient. Please do not hesitate to contact for any questions or concerns.   Total time spent on today's visit was 45 minutes dedicated to this patient today, preparing to see patient, examining the patient, ordering tests and/or medications and counseling the patient, documenting clinical information in the EHR or other health record, independently interpreting results and communicating results to the patient/family, discussing treatment and goals, answering patient's questions and coordinating care.  Cc:  , MD  08/29/2022 4:21 PM

## 2022-08-29 DIAGNOSIS — R413 Other amnesia: Secondary | ICD-10-CM | POA: Insufficient documentation

## 2022-09-04 ENCOUNTER — Other Ambulatory Visit: Payer: Self-pay

## 2022-09-04 DIAGNOSIS — F0670 Mild neurocognitive disorder due to known physiological condition without behavioral disturbance: Secondary | ICD-10-CM

## 2022-09-22 ENCOUNTER — Encounter (HOSPITAL_COMMUNITY): Payer: Self-pay | Admitting: Speech Pathology

## 2022-09-22 ENCOUNTER — Ambulatory Visit (HOSPITAL_COMMUNITY): Payer: PPO | Attending: Family Medicine | Admitting: Speech Pathology

## 2022-09-22 DIAGNOSIS — R41841 Cognitive communication deficit: Secondary | ICD-10-CM | POA: Diagnosis not present

## 2022-09-22 NOTE — Therapy (Signed)
OUTPATIENT SPEECH LANGUAGE PATHOLOGY EVALUATION   Patient Name: Natalie Morrow MRN: [id number] DOB:Feb 08, 1951, 72 y.o., female Today's Date: 09/22/2022  PCP: Manon Hilding, MD REFERRING PROVIDER: Rondel Jumbo, PA-C  END OF SESSION:  End of Session - 09/22/22 1709     Visit Number 1    Number of Visits 6    Date for SLP Re-Evaluation 11/05/22    Authorization Type [employer]   eff date 09/14/22 oop max 3200 met -0 ded-0 visit limit-no auth-no copay$15.00 co ins-0   SLP Start Time 1505    SLP Stop Time  54    SLP Time Calculation (min) 55 min    Activity Tolerance Patient tolerated treatment well             Past Medical History:  Diagnosis Date   AK (actinic keratosis) 09/17/2015   Arthritis    Bulging lumbar disc    Bursitis of hip    Fibromyalgia    GERD (gastroesophageal reflux disease)    Hormone replacement therapy (HRT) 09/05/2013   Hypercholesteremia    Hypothyroidism    Plantar fasciitis, bilateral    Rosacea    Past Surgical History:  Procedure Laterality Date   BACK SURGERY  05/2015   CHOLECYSTECTOMY     COLONOSCOPY N/A 11/10/2012   Procedure: COLONOSCOPY;  Surgeon: Rogene Houston, MD;  [facility];  Service: Endoscopy;  Laterality: N/A;  730   COLONOSCOPY WITH PROPOFOL N/A 04/08/2018   Procedure: COLONOSCOPY WITH PROPOFOL;  Surgeon: Rogene Houston, MD;  Location: [facility];  Service: Endoscopy;  Laterality: N/A;  100   TONSILLECTOMY     Patient Active Problem List   Diagnosis Date Noted   Memory impairment 08/29/2022   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[place]) 06/18/2022   Cognitive dysfunction 11/04/2020   Screening for colorectal cancer 12/07/2019   Encounter for well woman exam with routine gynecological exam 12/07/2019   Family hx of colon cancer 02/17/2018   Rectocele 10/12/2017   Well female exam with routine gynecological exam 10/12/2017   Vaginal dryness 10/12/2017   AK (actinic  keratosis) 09/17/2015    ONSET DATE: 08/28/2022   REFERRING DIAG: G30.9,F06.70 (ICD-10-CM) - 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[place])  THERAPY DIAG:  Cognitive communication deficit  Rationale for Evaluation and Treatment: Rehabilitation  SUBJECTIVE:   SUBJECTIVE STATEMENT: "Someone just took the words right out of my mouth." Pt accompanied by: significant other  PERTINENT HISTORY: Natalie Morrow is a very pleasant 72 y.o. left handed female who was referred for SLP evaluation and treatment by Sharene Butters, NP (neurology). Neuropsychological testing on 10/28/2020 showed mild cognitive deficits in memory and executive function but did not meet criteria for dementia.  PET scan of brain on 03/10/2021 showed decreased relative cortical metabolism within the left parietal lobe as may be seen in Alzheimer's type pathology. Patient was recently taken off rivastigmine 4.5 mg bid, due to possible increasing tremors and dizziness. Pt and spouse currently report that tremor and dizziness have improved, but that word finding appears worse. Most recent EEG was negative for seizure activity. Neurology noted some parkinsonian features during exam, such as hypomimia, R postural and intention tremor, shorter stride, gait is normal. She also appears to have some involuntary R arm spasmodic movements, mild. Latest MRI brain is remarkable for minimal chronic small vessel disease ( similar to prior MRI 02/02/21), stable mild generalized atrophy. She will be going for a DaT scan next  week. Pt reports feeling "sad" about her changes in memory and would like to "get back to normal". She no longer drives or cooks, but can stay at home for a few hours alone while her husband, [name], runs errands. She does not leave the house very often, but does have some friends who come by to visit. She has a son, Natalie Morrow, who lives in [place] and grandson Natalie Morrow, [place]). She last was employed at the [employer].  She enjoys watching westerns with her husband and the [employer] channel.  PAIN:  Are you having pain? No  FALLS: Has patient fallen in last 6 months?  No  LIVING ENVIRONMENT: Lives with: lives with their family and lives with their spouse Lives in: House/apartment  PLOF:  Level of assistance: Independent with ADLs Employment: Retired  PATIENT GOALS: "Get back to normal."  OBJECTIVE:   DIAGNOSTIC FINDINGS: MRI 07/2022: IMPRESSION: 1. No evidence of acute intracranial abnormality. 2. Minimal chronic small-vessel ischemic changes within the cerebral white matter, similar to the prior brain MRI of 02/02/2021. 3. Mild generalized cerebral atrophy, stable. 4. Mild paranasal sinus disease, as described.  COGNITION: Overall cognitive status: Impaired and History of cognitive impairments - at baseline Areas of impairment:  Attention: Impaired: Sustained Memory: Impaired: Immediate Working IT consultant function: Impaired: Problem solving, Organization, and Planning Functional deficits: Pt uses a calendar at home, has a Cytogeneticist, husband manages her medications and finances  AUDITORY COMPREHENSION: Overall auditory comprehension: Appears intact and Impaired: complex YES/NO questions: Impaired: complex Following directions: Impaired: complex Conversation: Moderately Complex Interfering components: attention and working memory Effective technique: repetition/stressing words, written cues, stressing words, and visual/gestural cues  READING COMPREHENSION: Impaired: Needs further assessment  EXPRESSION: verbal  VERBAL EXPRESSION: Level of generative/spontaneous verbalization: conversation Automatic speech: name: intact and social response: intact  Repetition: Impaired: sentence Naming: Responsive: 51-75%, Confrontation: 76-100%, and Divergent: 0-25% Pragmatics: Appears intact Comments: Pt loses her train of thought often and has word finding deficits Interfering  components: attention Effective technique: semantic cues Non-verbal means of communication: N/A  WRITTEN EXPRESSION: Dominant hand: left Written expression: Impaired: word  MOTOR SPEECH: Overall motor speech: Appears intact Level of impairment:  N/A Respiration: diaphragmatic/abdominal breathing Phonation: normal Resonance: WFL Articulation: Appears intact Intelligibility: Intelligible Motor planning: Appears intact Motor speech errors:  N/A Interfering components:  N/A Effective technique:  N/A  ORAL MOTOR EXAMINATION: Overall status: WFL Comments: N/A  STANDARDIZED ASSESSMENTS: SLUMS: 12/30 and [place] NAMING TEST: short form 13/15  [facility] SLUMS Examination Orientation  2/3 (+year and state)  Numeric Problem Solving  0/3  Memory  0/5  Attention 0/2  Thought Organization 0/3 (3 animals in 60 seconds)  Clock Drawing 0/4  Visuospatial Skills               2/2  Short Story Recall  8/8  Total  12/30     Scoring  High School Education  Less than High School Education   Normal  27-30 25-30  Mild Neurocognitive Disorder 21-26 20-24  Dementia  1-20 1-19     TODAY'S TREATMENT:      Evaluation only  DATE: 09/22/22    PATIENT EDUCATION: Education details: Plan for SLP therapy for 6 visits Person educated: Patient and Spouse Education method: Explanation Education comprehension: verbalized understanding and needs further education   GOALS: Goals reviewed with patient? Yes  SHORT TERM GOALS: Target date: 11/05/2022  Pt will implement word-finding strategies with 90% accuracy when unable to verbalize desired word in conversation/functional tasks with mi/mod assist.    Baseline: mod assist Goal status: INITIAL  2.  Pt will describe objects and pictures by providing at least three salient features as judged by clinician with 90% acc when  provided mi/mod cues. Baseline: 75% Goal status: INITIAL  3.  Pt will utilize external memory strategies in home and community environment to increase safety and recall with mi/mod cues and caregiver education. Baseline: needs to be set up Goal status: INITIAL  4.  Pt will verbally express thoughts by verbalizing sentences with mi/mod prompts from SLP/caregiver to help Pt stay on topic (word prompts, written prompts) with 90% effectiveness. Baseline: mod/max assist to guide the conversation Goal status: INITIAL   LONG TERM GOALS: Same as short term goals  ASSESSMENT:  CLINICAL IMPRESSION: Patient is a 72 y.o. female who was seen today for a cognitive linguistic evaluation due to mild cognitive impairment per neurology. Pt scored a 12/30 on the [employer] (see above) with relative strength in answering questions regarding a short store (8/8). She was only able to name 3 animals in 60 seconds, unable to complete mental calculations (but did complete addition, subtraction, and multiplication accurately with written cues), and unable to complete delayed recall task (2/5 for immediate recall and 0/5 for delayed recall). During the clock drawing task, she was able to write 12 (and few extra with repetition) numbers around the clock, but no hands. She achieved 13/15 on the [employer] short form. She completed a picture description task ([place] with relative strength in content words (husband reports these are often lacking at home). During conversation, Pt appeared to frequently lose her train of thought and responded well to SLP cues for guided conversation (will reinforce with written cues next session). Recommend short term SLP therapy to provide Pt and family education on how to improve communication at home and increase safety for the future given her memory deficits. Pt and spouse are in agreement with plan of care.   OBJECTIVE IMPAIRMENTS: include attention, memory,  executive functioning, expressive language, and receptive language. These impairments are limiting patient from return to work, managing medications, managing appointments, managing finances, and effectively communicating at home and in community. Factors affecting potential to achieve goals and functional outcome are ability to learn/carryover information, medical prognosis, and previous level of function. Patient will benefit from skilled SLP services to address above impairments and improve overall function.  REHAB POTENTIAL: Good  PLAN:  SLP FREQUENCY: 1x/week  SLP DURATION: 6 weeks  PLANNED INTERVENTIONS: Language facilitation, Environmental controls, Cueing hierachy, Cognitive reorganization, Internal/external aids, Functional tasks, Multimodal communication approach, SLP instruction and feedback, Compensatory strategies, and Patient/family education    Thank you,  Havery Moros, CCC-SLP [phone] Tamsin Nader, CCC-SLP 09/22/2022, 5:15 PM

## 2022-09-29 ENCOUNTER — Encounter (HOSPITAL_COMMUNITY)
Admission: RE | Admit: 2022-09-29 | Discharge: 2022-09-29 | Disposition: A | Discharge status: Home / Self Care | Payer: PPO | Source: Ambulatory Visit | Attending: Physician Assistant | Admitting: Physician Assistant

## 2022-09-29 DIAGNOSIS — R413 Other amnesia: Secondary | ICD-10-CM

## 2022-09-29 DIAGNOSIS — R251 Tremor, unspecified: Secondary | ICD-10-CM | POA: Insufficient documentation

## 2022-09-29 MED ORDER — POTASSIUM IODIDE (ANTIDOTE) 130 MG PO TABS: 130.0000 mg | ORAL_TABLET | Freq: Once | ORAL | Status: DC

## 2022-09-29 MED ORDER — POTASSIUM IODIDE (ANTIDOTE) 130 MG PO TABS
ORAL_TABLET | ORAL | Status: AC
  Filled 2022-09-29: qty 1

## 2022-09-29 MED ORDER — IOFLUPANE I 123 185 MBQ/2.5ML IV SOLN
4.9000 | Freq: Once | INTRAVENOUS | Status: AC | PRN
  Administered 2022-09-29: 4.9 via INTRAVENOUS

## 2022-09-30 ENCOUNTER — Ambulatory Visit: Payer: PPO | Admitting: Physician Assistant

## 2022-09-30 ENCOUNTER — Encounter: Payer: Self-pay | Admitting: Physician Assistant

## 2022-09-30 VITALS — BP 107/56 | HR 76 | Resp 18 | Ht 65.0 in | Wt 121.0 lb

## 2022-09-30 DIAGNOSIS — G20A1 Parkinson's disease without dyskinesia, without mention of fluctuations: Secondary | ICD-10-CM

## 2022-09-30 DIAGNOSIS — R413 Other amnesia: Secondary | ICD-10-CM | POA: Diagnosis not present

## 2022-09-30 MED ORDER — CARBIDOPA-LEVODOPA 25-100 MG PO TABS: ORAL_TABLET | ORAL | 3 refills | Status: DC

## 2022-09-30 NOTE — Patient Instructions (Addendum)
Start Carbidopa Levodopa as follows: Take 1/2 tablet three times daily, at least 30 minutes before meals, for one week Then take 1/2 tablet in the morning, 1/2 tablet in the afternoon, 1 tablet in the evening, at least 30 minutes before meals, for one week Then take 1/2 tablet in the morning, 1 tablet in the afternoon, 1 tablet in the evening, at least 30 minutes before meals, for one week Then take 1 tablet three times daily, at least 30 minutes before meals  Physical Therapy  Will hold for 1 month restarting rivastigmine  Continue speech therapy Follow as scheduled Repeat neuropsych evaluation for clarity of the diagnosis

## 2022-09-30 NOTE — Progress Notes (Signed)
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 Natalie Morrow female presenting today in follow-up for evaluation of memory loss and to discuss the recent results of her DaTscan .  In review,  a neuropsychological testing on 10/28/2020 showed mild cognitive deficits in memory and executive function, but did not meet the criteria for dementia.  PET scan of the brain on 03/10/2021 showed decrease relative cortical metabolism within the left parietal lobe as may be seen in Alzheimer's type pathology.  She was on rivastigmine but because of the tremors, she wanted to discontinue the med and see if this would improve. Tremors remained and memory has not gotten better. During that exam she had hypomimia, right postural and intention tremor, shorter stride, and the gait was normal.  She had some involuntary right arm and spasmodic movements which were mild with normal EEG. 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 In today's visit, she has B, R>L intention tremor and mild facial tremor.  She is not on Sinemet, it is felt prudent to initiate the medicine.      Recommendations:   Follow up in 2  months. Start Carbidopa Levodopa 25/100 #180  as follows: Take 1/2 tablet three times daily, at least 30 minutes before meals, for one week Then take 1/2 tablet in the morning, 1/2 tablet in the afternoon, 1 tablet in the evening, at least 30 minutes before meals, for one week Then take 1/2 tablet in the morning, 1 tablet in the afternoon, 1 tablet in the evening, at least 30 minutes before meals, for one week Then take 1 tablet three times daily, at least 30 minutes before meals Physical Therapy for strength and balance Will hold for 1 month restarting rivastigmine  Continue speech therapy Follow as scheduled Repeat Neuropsych evaluation for clarity of the diagnosis and disease  progression Continue B12 supplement  Continue to monitor cardiovascular risk factors Continue to control mood as per PCP    Subjective:   This patient is accompanied in the office by her husband   who supplements the history. Previous records as well as any outside records available were reviewed prior to todays visit.   Patient was last seen on 08/28/2022     Any changes in memory since last visit? " Memory is not too good since off the medication".  STM is not good according to her.  She has trouble putting sentences together, trying to combine what she wants to say.  She is able to remember conversations and names of family members.  Hypophonia is notedLong-term memory is good.  She is able to perform simple ADLs without any issues. repeats oneself?  Endorsed Disoriented when walking into a room?  Patient denies  Leaving objects in unusual places?  Patient denies   Wandering behavior?   denies   Any personality changes since last visit?   denies   Any worsening depression?: denies   Hallucinations or paranoia?  denies   Seizures?   denies    Any sleep changes?  Denies  vivid dreams, REM behavior or sleepwalking   Sleep apnea?   denies   Any hygiene concerns?   denies   Independent of bathing and dressing?  Endorsed  Does the patient needs help with medications?  Husband is in charge he places them on a pillbox for her Who is in charge of the finances?  Husband is in charge  Any changes in appetite?  denies    Patient have trouble swallowing?  denies  Increased drooling? Endorsed , for the last 2 months   Any kitchen accidents such as leaving the stove on?   denies   Any headaches?    denies   Vision changes? denies Chronic back pain  denies   Ambulates with difficulty?   She reports some balance difficulty, slow gait cane or walker. Denies dropping objects or trouble buttoning or writing Leg cramps?no Recent falls or head injuries?    denies     Unilateral weakness,  numbness or tingling?   denies   Any anosmia?  Endorsed  Any incontinence of urine?  denies   Any bowel dysfunction?  denies      Patient lives  with husband Does the patient drive?no  Diplopia?  "Sometimes something runs to me " Skin issues: Actinic Keratosis  Any tremors?  Endorsed.  She has a history of mild chronic hand tremors, worse at night, right greater than left, with some involuntary spasmodic movements.  She is left-handed, so she denies any issues with writing.        Initial evaluation 07/28/21  On the evening of 08/04/2020, she did not recognize her husband.  She wasn't agitated but didn't want to sleep in the same bed and asked him to leave the house.  The next morning, she still didn't recognize her husband.  Her son came over and she did recognize her son.  They brought her to the ED.  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  CMP showed normal kidney and liver function with no electrolyte abnormalities.  TSH was 1.687.  WBC on CBC was normal.  UA was positive for UTI.  She was discharged on antibiotics.  B12, folate and RPR reportedly normal.  Over the next several weeks, symptoms slowly improved but have not completely resolved.  She will still sometimes briefly not recognize her husband.  On one occasion, she did not really recognize her son either.  She has still been able to perform all ADLs however she has not been driving since this started.  No headache, fever, cough.  Prior to onset, she had been experiencing dizziness/vertigo.  Also, she appeared to not be as talkative on the phone.  She has been under increased stress caring for her husband who has been having worsening kidney disease and may need to start dialysis.  EEG on 09/25/2020 showed mild left temporal slowing.  She underwent neuropsychological testing on 10/28/2020 showed mild cognitive deficits in memory and  executive function but did not meet criteria for dementia.  PET scan of brain on 03/10/2021 showed decreased relative cortical metabolism within the left parietal lobe as may be seen in Alzheimer's type pathology. Mother and maternal aunt dementia. Her sister has been experiencing symptoms as well.   DaTscan 09/29/2022, shows decreased radiotracer activity in the left and right striata in a pattern suggestive of Parkinson syndrome pathology, greater deficit on the left.   MRI brain 07/15/22 No evidence of acute intracranial abnormality.2. Minimal chronic small-vessel ischemic changes within the cerebral white matter, similar to the prior brain MRI of 02/02/2021.3. Mild generalized cerebral atrophy, stable.4. Mild paranasal sinus disease, as described.  PREVIOUS MEDICATIONS: donepezil "very vivid dreams", discontinued on 07/2021.  Rivastigmine (the patient believes that this causes tremors).  Past Medical History:  Diagnosis Date   AK (actinic keratosis)  09/17/2015   Arthritis    Bulging lumbar disc    Bursitis of hip    Fibromyalgia    GERD (gastroesophageal reflux disease)    Hormone replacement therapy (HRT) 09/05/2013   Hypercholesteremia    Hypothyroidism    Plantar fasciitis, bilateral    Rosacea      Past Surgical History:  Procedure Laterality Date   BACK SURGERY  05/2015   CHOLECYSTECTOMY     COLONOSCOPY N/A 11/10/2012   Procedure: COLONOSCOPY;  Surgeon: Rogene Houston, MD;  [facility];  Service: Endoscopy;  Laterality: N/A;  730   COLONOSCOPY WITH PROPOFOL N/A 04/08/2018   Procedure: COLONOSCOPY WITH PROPOFOL;  Surgeon: Rogene Houston, MD;  [facility];  Service: Endoscopy;  Laterality: N/A;  100   TONSILLECTOMY       PREVIOUS MEDICATIONS:   CURRENT MEDICATIONS:  Outpatient Encounter Medications as of 09/30/2022  Medication Sig   acetaminophen (TYLENOL) 500 MG tablet Take 500 mg by mouth as needed for mild pain.    carbidopa-levodopa (SINEMET)  25-100 MG tablet Start Carbidopa Levodopa as follows:  Take 1/2 tablet three times daily, at least 30 minutes before meals, for one week  Then take 1/2 tablet in the morning, 1/2 tablet in the afternoon, 1 tablet in the evening, at least 30 minutes before meals, for one week  Then take 1/2 tablet in the morning, 1 tablet in the afternoon, 1 tablet in the evening, at least 30 minutes before meals, for one week  Then take 1 tablet three times daily, at least 30 minutes before meals   cholecalciferol (VITAMIN D3) 25 MCG (1000 UNIT) tablet Take 1,000 Units by mouth daily.   escitalopram (LEXAPRO) 5 MG tablet Take 1 tablet (5 mg total) by mouth daily.   Famotidine (PEPCID AC PO) Take by mouth daily.   levothyroxine (SYNTHROID, LEVOTHROID) 50 MCG tablet Take 50 mcg by mouth daily.   loratadine (CLARITIN) 10 MG tablet Take 10 mg by mouth daily as needed for allergies.   rivastigmine (EXELON) 4.5 MG capsule Take 1 capsule at night for 1 week, then increase to 1 capsule twice daily   Probiotic Product (ALIGN) 4 MG CAPS Take 4 mg by mouth 2 (two) times a week.   Facility-Administered Encounter Medications as of 09/30/2022  Medication   potassium iodide (IOSAT) tablet 130 mg     Objective:     PHYSICAL EXAMINATION:    VITALS:   Vitals:   09/30/22 1246  BP: (!) 107/56  Pulse: 76  Resp: 18  SpO2: 98%  Weight: 121 lb (54.9 kg)  Height: 5\' 5"  (1.651 m)    GEN:  The patient appears stated age and is in NAD. HEENT:  Normocephalic, atraumatic.   Neurological examination:  General: NAD, well-groomed, appears stated age. Orientation: The patient is alert. Oriented to person, place and date. Flat affect  Cranial nerves: There is good facial symmetry.The speech is fluent and clear, mild hypophonia  No aphasia or dysarthria. Fund of knowledge is appropriate. Recent memory impaired and remote memory is normal.  Attention and concentration are normal.  Able to name objects and repeat phrases.   Hearing is intact to conversational tone.    Sensation: Sensation is intact to light touch throughout Motor: Strength is at least antigravity x4. DTR's 2/4 in UE/LE       No data to display             06/24/2022   11:46 AM 09/19/2021  8:00 AM 10/28/2020   10:00 AM  MMSE - Mini Mental State Exam  Orientation to time 2 3 5   Orientation to Place 3 5 5   Registration 3 3 3   Attention/ Calculation 5 2 3   Recall 2 2 0  Language- name 2 objects 2 2 2   Language- repeat 1 1 1   Language- follow 3 step command 2 3 1   Language- read & follow direction 1 1 1   Write a sentence 0 0 1  Copy design 0 0 0  Total score 21 22 22        Movement examination: Tone: There is mildly increased RUE > LUE tone in the UE/LE Abnormal movements:  B R>L tremors on intention, mild facial tremor,   No myoclonus.  No asterixis.   Coordination:  There is no decremation with RAM's. Normal finger to nose  Gait and Station: The patient has no difficulty arising out of a deep-seated chair without the use of the hands. The patient's stride length is short .  Gait is cautious and narrow. Leans to the L ,mildly flexes forward  Thank you for allowing the opportunity to participate in the care of this nice patient. Please do not hesitate to contact for any questions or concerns.   Total time spent on today's visit was 31 minutes dedicated to this patient today, preparing to see patient, examining the patient, ordering tests and/or medications and counseling the patient, documenting clinical information in the EHR or other health record, independently interpreting results and communicating results to the patient/family, discussing treatment and goals, answering patient's questions and coordinating care.  Cc:  , MD  09/30/2022 2:00 PM

## 2022-10-01 ENCOUNTER — Other Ambulatory Visit: Payer: Self-pay | Admitting: Physician Assistant

## 2022-10-01 ENCOUNTER — Telehealth: Payer: Self-pay | Admitting: Anesthesiology

## 2022-10-01 MED ORDER — CARBIDOPA-LEVODOPA 25-100 MG PO TABS: ORAL_TABLET | ORAL | 3 refills | Status: DC

## 2022-10-01 NOTE — Telephone Encounter (Signed)
Pt's husband called stating he called the pharmacy to check on pt's medication carbidopa-levodopa states pharmacy did not receive prescription.

## 2022-10-01 NOTE — Telephone Encounter (Signed)
Can you advise. No message thank you

## 2022-10-01 NOTE — Telephone Encounter (Signed)
Sent and faxed again

## 2022-10-01 NOTE — Telephone Encounter (Signed)
Will reprint, thanks

## 2022-10-06 ENCOUNTER — Encounter (HOSPITAL_COMMUNITY): Payer: PPO | Admitting: Speech Pathology

## 2022-10-08 ENCOUNTER — Encounter (HOSPITAL_COMMUNITY): Payer: Self-pay | Admitting: Speech Pathology

## 2022-10-08 ENCOUNTER — Ambulatory Visit (HOSPITAL_COMMUNITY): Payer: PPO | Admitting: Speech Pathology

## 2022-10-08 DIAGNOSIS — R41841 Cognitive communication deficit: Secondary | ICD-10-CM

## 2022-10-08 NOTE — Therapy (Signed)
OUTPATIENT SPEECH LANGUAGE PATHOLOGY TREATMENT NOTE   Patient Name: Natalie Morrow MRN: [id number] DOB:06-Jul-1951, 72 y.o., female Today's Date: 10/08/2022  PCP: Natalie Hilding, MD REFERRING PROVIDER: Rondel Jumbo, PA-C  END OF SESSION:   End of Session - 10/08/22 1151     Visit Number 2    Number of Visits 6    Date for SLP Re-Evaluation 11/05/22    Authorization Type [employer]   eff date 09/14/22 oop max 3200 met -0 ded-0 visit limit-no auth-no copay$15.00 co ins-0   SLP Start Time 41    SLP Stop Time  1215    SLP Time Calculation (min) 45 min    Activity Tolerance Patient tolerated treatment well             Past Medical History:  Diagnosis Date   AK (actinic keratosis) 09/17/2015   Arthritis    Bulging lumbar disc    Bursitis of hip    Fibromyalgia    GERD (gastroesophageal reflux disease)    Hormone replacement therapy (HRT) 09/05/2013   Hypercholesteremia    Hypothyroidism    Plantar fasciitis, bilateral    Rosacea    Past Surgical History:  Procedure Laterality Date   BACK SURGERY  05/2015   CHOLECYSTECTOMY     COLONOSCOPY N/A 11/10/2012   Procedure: COLONOSCOPY;  Surgeon: Rogene Houston, MD;  [facility];  Service: Endoscopy;  Laterality: N/A;  730   COLONOSCOPY WITH PROPOFOL N/A 04/08/2018   Procedure: COLONOSCOPY WITH PROPOFOL;  Surgeon: Rogene Houston, MD;  [facility]: [facility];  Service: Endoscopy;  Laterality: N/A;  100   TONSILLECTOMY     Patient Active Problem List   Diagnosis Date Noted   Memory impairment 08/29/2022   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[place]) 06/18/2022   Cognitive dysfunction 11/04/2020   Screening for colorectal cancer 12/07/2019   Encounter for well woman exam with routine gynecological exam 12/07/2019   Family hx of colon cancer 02/17/2018   Rectocele 10/12/2017   Well female exam with routine gynecological exam 10/12/2017   Vaginal dryness 10/12/2017   AK (actinic  keratosis) 09/17/2015    ONSET DATE: 08/28/2022    REFERRING DIAG: G30.9,F06.70 (ICD-10-CM) - 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[place])   THERAPY DIAG:  Cognitive communication deficit   Rationale for Evaluation and Treatment: Rehabilitation  PERTINENT HISTORY: Natalie Morrow is a very pleasant 72 y.o. left handed female who was referred for SLP evaluation and treatment by Natalie Butters, NP (neurology). Neuropsychological testing on 10/28/2020 showed mild cognitive deficits in memory and executive function but did not meet criteria for dementia.  PET scan of brain on 03/10/2021 showed decreased relative cortical metabolism within the left parietal lobe as may be seen in Alzheimer's type pathology. Patient was recently taken off rivastigmine 4.5 mg bid, due to possible increasing tremors and dizziness. Pt and spouse currently report that tremor and dizziness have improved, but that word finding appears worse. Most recent EEG was negative for seizure activity. Neurology noted some parkinsonian features during exam, such as hypomimia, R postural and intention tremor, shorter stride, gait is normal. She also appears to have some involuntary R arm spasmodic movements, mild. Latest MRI brain is remarkable for minimal chronic small vessel disease ( similar to prior MRI 02/02/21), stable mild generalized atrophy. She will be going for a DaT scan next week. Pt reports feeling "sad" about her changes in memory and would like to "get back  to normal". She no longer drives or cooks, but can stay at home for a few hours alone while her husband, [name], runs errands. She does not leave the house very often, but does have some friends who come by to visit. She has a son, Natalie Morrow, who lives in [place] and grandson Natalie Morrow, [place]). She last was employed at the [employer]. She enjoys watching westerns with her husband and the [employer] channel.  Other: DaT scan:  <<FINDINGS: Decreased radiotracer  activity within the LEFT striatum with near absent loss in the LEFT putamen and decreased activity in the head of the LEFT caudate nucleus.   Decreased radiotracer activity in the RIGHT putamen and relative the maintained activity in the head of the RIGHT caudate nucleus.   IMPRESSION: Decreased radiotracer activity in the LEFT and RIGHT striatum in aa pattern suggestive of Parkinson's syndrome pathology. Greater deficit on the LEFT.   Of note, DaTSCAN is not diagnostic of Parkinsonian syndromes, which remains a clinical diagnosis. DaTscan is an adjuvant test to aid in the clinical diagnosis of Parkinsonian syndromes.>>  SUBJECTIVE:   SUBJECTIVE STATEMENT: "Well I had that test last week."   PAIN:  Are you having pain? No   OBJECTIVE:  GOALS: Goals reviewed with patient? Yes   SHORT TERM GOALS: Target date: 11/05/2022   Pt will implement word-finding strategies with 90% accuracy when unable to verbalize desired word in conversation/functional tasks with mi/mod assist.    Baseline: mod assist Goal status: ONGOING   2.  Pt will describe objects and pictures by providing at least three salient features as judged by clinician with 90% acc when provided mi/mod cues. Baseline: 75% Goal status: ONGOING   3.  Pt will utilize external memory strategies in home and community environment to increase safety and recall with mi/mod cues and caregiver education. Baseline: needs to be set up Goal status: ONGOING   4.  Pt will verbally express thoughts by verbalizing sentences with mi/mod prompts from SLP/caregiver to help Pt stay on topic (word prompts, written prompts) with 90% effectiveness. Baseline: mod/max assist to guide the conversation Goal status: ONGOING     LONG TERM GOALS: Same as short term goals   ASSESSMENT:   CLINICAL IMPRESSION: (from initial evaluation) Patient is a 72 y.o. female who was seen today for a cognitive linguistic evaluation due to mild cognitive  impairment per neurology. Pt scored a 12/30 on the [facility] (see above) with relative strength in answering questions regarding a short store (8/8). She was only able to name 3 animals in 60 seconds, unable to complete mental calculations (but did complete addition, subtraction, and multiplication accurately with written cues), and unable to complete delayed recall task (2/5 for immediate recall and 0/5 for delayed recall). During the clock drawing task, she was able to write 12 (and few extra with repetition) numbers around the clock, but no hands. She achieved 13/15 on the [employer] short form. She completed a picture description task [place] with relative strength in content words (husband reports these are often lacking at home). During conversation, Pt appeared to frequently lose her train of thought and responded well to SLP cues for guided conversation (will reinforce with written cues next session). Recommend short term SLP therapy to provide Pt and family education on how to improve communication at home and increase safety for the future given her memory deficits. Pt and spouse are in agreement with plan of care.    OBJECTIVE IMPAIRMENTS:  include attention, memory, executive functioning, expressive language, and receptive language. These impairments are limiting patient from return to work, managing medications, managing appointments, managing finances, and effectively communicating at home and in community. Factors affecting potential to achieve goals and functional outcome are ability to learn/carryover information, medical prognosis, and previous level of function. Patient will benefit from skilled SLP services to address above impairments and improve overall function.   REHAB POTENTIAL: Good   PLAN:   SLP FREQUENCY: 1x/week   SLP DURATION: 5 weeks   PLANNED INTERVENTIONS: Language facilitation, Environmental controls, Cueing hierachy, Cognitive reorganization,  Internal/external aids, Functional tasks, Multimodal communication approach, SLP instruction and feedback, Compensatory strategies, and Patient/family education      TODAY'S TREATMENT: Pt accompanied to SLP therapy by her husband, JW. Pt was given a folder for HEP and education. SLP reviewed goals for therapy, provided written word finding strategies, and introduced personalized communication word bank. SLP provided min cues to elicit biographical information, names of family members, friends, and medical professionals, and restaurants/stores they frequent. SLP provided education regarding benefit of having written cues for Pt to refer to in order to assist with expressive communication and memory. Pt was able to provide information with mild/mod cues from SLP and spouse. She was then able to locate/read the information with 100% acc when provided min visual cue. She had her DaT scan and the neurology team started her on Parkinson's medication last week. Next session, incorporate word finding strategies into functional tasks.                                                                                                                                       DATE: 10/08/22    Thank you,   Genene Churn, [place], [place] 10/08/2022, 12:37 PM

## 2022-10-15 ENCOUNTER — Encounter (HOSPITAL_COMMUNITY): Payer: Self-pay | Admitting: Speech Pathology

## 2022-10-15 ENCOUNTER — Ambulatory Visit (HOSPITAL_COMMUNITY): Payer: PPO | Attending: Physician Assistant | Admitting: Speech Pathology

## 2022-10-15 DIAGNOSIS — R41841 Cognitive communication deficit: Secondary | ICD-10-CM | POA: Diagnosis not present

## 2022-10-15 NOTE — Therapy (Signed)
OUTPATIENT SPEECH LANGUAGE PATHOLOGY TREATMENT NOTE   Patient Name: Natalie Morrow MRN: [id number] DOB:08/15/1951, 72 y.o., female Today's Date: 10/15/2022  PCP: Manon Hilding, MD REFERRING PROVIDER: Rondel Jumbo, PA-C  END OF SESSION:   End of Session - 10/15/22 1218     Visit Number 3    Number of Visits 6    Date for SLP Re-Evaluation 11/05/22    Authorization Type [employer]   eff date 09/14/22 oop max 3200 met -0 ded-0 visit limit-no auth-no copay$15.00 co ins-0   SLP Start Time 1115    SLP Stop Time  1205    SLP Time Calculation (min) 50 min    Activity Tolerance Patient tolerated treatment well             Past Medical History:  Diagnosis Date   AK (actinic keratosis) 09/17/2015   Arthritis    Bulging lumbar disc    Bursitis of hip    Fibromyalgia    GERD (gastroesophageal reflux disease)    Hormone replacement therapy (HRT) 09/05/2013   Hypercholesteremia    Hypothyroidism    Plantar fasciitis, bilateral    Rosacea    Past Surgical History:  Procedure Laterality Date   BACK SURGERY  05/2015   CHOLECYSTECTOMY     COLONOSCOPY N/A 11/10/2012   Procedure: COLONOSCOPY;  Surgeon: Rogene Houston, MD;  [facility];  Service: Endoscopy;  Laterality: N/A;  730   COLONOSCOPY WITH PROPOFOL N/A 04/08/2018   Procedure: COLONOSCOPY WITH PROPOFOL;  Surgeon: Rogene Houston, MD;  [facility];  Service: Endoscopy;  Laterality: N/A;  100   TONSILLECTOMY     Patient Active Problem List   Diagnosis Date Noted   Memory impairment 08/29/2022   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[place]) 06/18/2022   Cognitive dysfunction 11/04/2020   Screening for colorectal cancer 12/07/2019   Encounter for well woman exam with routine gynecological exam 12/07/2019   Family hx of colon cancer 02/17/2018   Rectocele 10/12/2017   Well female exam with routine gynecological exam 10/12/2017   Vaginal dryness 10/12/2017   AK (actinic  keratosis) 09/17/2015    ONSET DATE: 08/28/2022    REFERRING DIAG: G30.9,F06.70 (ICD-10-CM) - 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[place])   THERAPY DIAG:  Cognitive communication deficit   Rationale for Evaluation and Treatment: Rehabilitation  PERTINENT HISTORY: Natalie Morrow is a very pleasant 72 y.o. left handed female who was referred for SLP evaluation and treatment by Sharene Butters, NP (neurology). Neuropsychological testing on 10/28/2020 showed mild cognitive deficits in memory and executive function but did not meet criteria for dementia.  PET scan of brain on 03/10/2021 showed decreased relative cortical metabolism within the left parietal lobe as may be seen in Alzheimer's type pathology. Patient was recently taken off rivastigmine 4.5 mg bid, due to possible increasing tremors and dizziness. Pt and spouse currently report that tremor and dizziness have improved, but that word finding appears worse. Most recent EEG was negative for seizure activity. Neurology noted some parkinsonian features during exam, such as hypomimia, R postural and intention tremor, shorter stride, gait is normal. She also appears to have some involuntary R arm spasmodic movements, mild. Latest MRI brain is remarkable for minimal chronic small vessel disease ( similar to prior MRI 02/02/21), stable mild generalized atrophy. She will be going for a DaT scan next week. Pt reports feeling "sad" about her changes in memory and would like to "get back  to normal". She no longer drives or cooks, but can stay at home for a few hours alone while her husband, [name], runs errands. She does not leave the house very often, but does have some friends who come by to visit. She has a son, [name], who lives in [place] and grandson [name], [place]). She last was employed at the [employer]. She enjoys watching westerns with her husband and the [employer] channel.  Other: DaT scan:  <<FINDINGS: Decreased radiotracer  activity within the LEFT striatum with near absent loss in the LEFT putamen and decreased activity in the head of the LEFT caudate nucleus.   Decreased radiotracer activity in the RIGHT putamen and relative the maintained activity in the head of the RIGHT caudate nucleus.   IMPRESSION: Decreased radiotracer activity in the LEFT and RIGHT striatum in aa pattern suggestive of Parkinson's syndrome pathology. Greater deficit on the LEFT.   Of note, DaTSCAN is not diagnostic of Parkinsonian syndromes, which remains a clinical diagnosis. DaTscan is an adjuvant test to aid in the clinical diagnosis of Parkinsonian syndromes.>>  SUBJECTIVE:   SUBJECTIVE STATEMENT: "Barbecue."   PAIN:  Are you having pain? No   OBJECTIVE:  GOALS: Goals reviewed with patient? Yes   SHORT TERM GOALS: Target date: 11/05/2022   Pt will implement word-finding strategies with 90% accuracy when unable to verbalize desired word in conversation/functional tasks with mi/mod assist.    Baseline: mod assist Goal status: ONGOING   2.  Pt will describe objects and pictures by providing at least three salient features as judged by clinician with 90% acc when provided mi/mod cues. Baseline: 75% Goal status: ONGOING   3.  Pt will utilize external memory strategies in home and community environment to increase safety and recall with mi/mod cues and caregiver education. Baseline: needs to be set up Goal status: ONGOING   4.  Pt will verbally express thoughts by verbalizing sentences with mi/mod prompts from SLP/caregiver to help Pt stay on topic (word prompts, written prompts) with 90% effectiveness. Baseline: mod/max assist to guide the conversation Goal status: ONGOING     LONG TERM GOALS: Same as short term goals   ASSESSMENT:   CLINICAL IMPRESSION: (from initial evaluation) Patient is a 72 y.o. female who was seen today for a cognitive linguistic evaluation due to mild cognitive impairment per neurology.  Pt scored a 12/30 on the [facility] (see above) with relative strength in answering questions regarding a short store (8/8). She was only able to name 3 animals in 60 seconds, unable to complete mental calculations (but did complete addition, subtraction, and multiplication accurately with written cues), and unable to complete delayed recall task (2/5 for immediate recall and 0/5 for delayed recall). During the clock drawing task, she was able to write 12 (and few extra with repetition) numbers around the clock, but no hands. She achieved 13/15 on the [place] short form. She completed a picture description task ([place] with relative strength in content words (husband reports these are often lacking at home). During conversation, Pt appeared to frequently lose her train of thought and responded well to SLP cues for guided conversation (will reinforce with written cues next session). Recommend short term SLP therapy to provide Pt and family education on how to improve communication at home and increase safety for the future given her memory deficits. Pt and spouse are in agreement with plan of care.    OBJECTIVE IMPAIRMENTS: include attention, memory, executive functioning, expressive  language, and receptive language. These impairments are limiting patient from return to work, managing medications, managing appointments, managing finances, and effectively communicating at home and in community. Factors affecting potential to achieve goals and functional outcome are ability to learn/carryover information, medical prognosis, and previous level of function. Patient will benefit from skilled SLP services to address above impairments and improve overall function.   REHAB POTENTIAL: Good   PLAN:   SLP FREQUENCY: 1x/week   SLP DURATION: 5 weeks   PLANNED INTERVENTIONS: Language facilitation, Environmental controls, Cueing hierachy, Cognitive reorganization, Internal/external aids,  Functional tasks, Multimodal communication approach, SLP instruction and feedback, Compensatory strategies, and Patient/family education      TODAY'S TREATMENT: Pt accompanied to SLP therapy by her husband, JW. He indicated that she awoke in the middle of the night and was disoriented. He reported that when she has an appointment the following day, she becomes anxious and asks about the appointments repeatedly. SLP suggested that they use a dry erase board or pad of paper with basic written schedule. They could also right the answer to some of her repetitive questions and have her refer to that. He shared that they were sitting on the couch together, when she asked why he was sitting near her and asked who he was. SLP suggested that they get a couple of photographs of the two of them together from when they were younger with a progression of them now with a written description beneath. In session, she was introduced to word finding strategies and participated in use via: she describes picture with three salient features, she guess to SLP provided description, and she answers questions to describe a word. She required mod/max cues for completion, however husband educated on ways to incorporate this at home when she is unable to state desired word. She also added some food items, previous trips taken, and personal preferences to her communication support page with assist from SLP. She was then able to locate/read the information with 100% acc when provided min visual cue. Next session, provide ongoing education on ways to set up the environment at home to decrease anxiety and communication breakdowns due to memory deficits.                                                                                                                                        DATE: 10/15/22    Thank you,   Genene Churn, [place], [place] 10/15/2022, 12:21 PM

## 2022-10-21 ENCOUNTER — Encounter (HOSPITAL_COMMUNITY): Payer: Self-pay | Admitting: Speech Pathology

## 2022-10-21 ENCOUNTER — Ambulatory Visit (HOSPITAL_COMMUNITY): Payer: PPO | Admitting: Speech Pathology

## 2022-10-21 DIAGNOSIS — R41841 Cognitive communication deficit: Secondary | ICD-10-CM

## 2022-10-21 NOTE — Therapy (Signed)
OUTPATIENT SPEECH LANGUAGE PATHOLOGY TREATMENT NOTE   Patient Name: Natalie Morrow MRN: [id number] DOB:March 09, 1951, 72 y.o., female Today's Date: 10/21/2022  PCP: Manon Hilding, MD REFERRING PROVIDER: Rondel Jumbo, PA-C  END OF SESSION:   End of Session - 10/21/22 1152     Visit Number 4    Number of Visits 6    Date for SLP Re-Evaluation 11/05/22    Authorization Type [employer]   eff date 09/14/22 oop max 3200 met -0 ded-0 visit limit-no auth-no copay$15.00 co ins-0   SLP Start Time 1030    SLP Stop Time  1120    SLP Time Calculation (min) 50 min    Activity Tolerance Patient tolerated treatment well             Past Medical History:  Diagnosis Date   AK (actinic keratosis) 09/17/2015   Arthritis    Bulging lumbar disc    Bursitis of hip    Fibromyalgia    GERD (gastroesophageal reflux disease)    Hormone replacement therapy (HRT) 09/05/2013   Hypercholesteremia    Hypothyroidism    Plantar fasciitis, bilateral    Rosacea    Past Surgical History:  Procedure Laterality Date   BACK SURGERY  05/2015   CHOLECYSTECTOMY     COLONOSCOPY N/A 11/10/2012   Procedure: COLONOSCOPY;  Surgeon: Rogene Houston, MD;  [facility];  Service: Endoscopy;  Laterality: N/A;  730   COLONOSCOPY WITH PROPOFOL N/A 04/08/2018   Procedure: COLONOSCOPY WITH PROPOFOL;  Surgeon: Rogene Houston, MD;  [facility]: [facility];  Service: Endoscopy;  Laterality: N/A;  100   TONSILLECTOMY     Patient Active Problem List   Diagnosis Date Noted   Memory impairment 08/29/2022   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[place]) 06/18/2022   Cognitive dysfunction 11/04/2020   Screening for colorectal cancer 12/07/2019   Encounter for well woman exam with routine gynecological exam 12/07/2019   Family hx of colon cancer 02/17/2018   Rectocele 10/12/2017   Well female exam with routine gynecological exam 10/12/2017   Vaginal dryness 10/12/2017   AK (actinic  keratosis) 09/17/2015    ONSET DATE: 08/28/2022    REFERRING DIAG: G30.9,F06.70 (ICD-10-CM) - 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[place])   THERAPY DIAG:  Cognitive communication deficit   Rationale for Evaluation and Treatment: Rehabilitation  PERTINENT HISTORY: Natalie Morrow is a very pleasant 72 y.o. left handed female who was referred for SLP evaluation and treatment by Sharene Butters, NP (neurology). Neuropsychological testing on 10/28/2020 showed mild cognitive deficits in memory and executive function but did not meet criteria for dementia.  PET scan of brain on 03/10/2021 showed decreased relative cortical metabolism within the left parietal lobe as may be seen in Alzheimer's type pathology. Patient was recently taken off rivastigmine 4.5 mg bid, due to possible increasing tremors and dizziness. Pt and spouse currently report that tremor and dizziness have improved, but that word finding appears worse. Most recent EEG was negative for seizure activity. Neurology noted some parkinsonian features during exam, such as hypomimia, R postural and intention tremor, shorter stride, gait is normal. She also appears to have some involuntary R arm spasmodic movements, mild. Latest MRI brain is remarkable for minimal chronic small vessel disease ( similar to prior MRI 02/02/21), stable mild generalized atrophy. She will be going for a DaT scan next week. Pt reports feeling "sad" about her changes in memory and would like to "get back  to normal". She no longer drives or cooks, but can stay at home for a few hours alone while her husband, [name], runs errands. She does not leave the house very often, but does have some friends who come by to visit. She has a son, [name], who lives in [place] and grandson [name], [place]). She last was employed at the [employer]. She enjoys watching westerns with her husband and the [employer] channel.  Other: DaT scan:  <<FINDINGS: Decreased radiotracer  activity within the LEFT striatum with near absent loss in the LEFT putamen and decreased activity in the head of the LEFT caudate nucleus.   Decreased radiotracer activity in the RIGHT putamen and relative the maintained activity in the head of the RIGHT caudate nucleus.   IMPRESSION: Decreased radiotracer activity in the LEFT and RIGHT striatum in aa pattern suggestive of Parkinson's syndrome pathology. Greater deficit on the LEFT.   Of note, DaTSCAN is not diagnostic of Parkinsonian syndromes, which remains a clinical diagnosis. DaTscan is an adjuvant test to aid in the clinical diagnosis of Parkinsonian syndromes.>>  SUBJECTIVE:   SUBJECTIVE STATEMENT: "Barbecue."   PAIN:  Are you having pain? No   OBJECTIVE:  GOALS: Goals reviewed with patient? Yes   SHORT TERM GOALS: Target date: 11/05/2022   Pt will implement word-finding strategies with 90% accuracy when unable to verbalize desired word in conversation/functional tasks with mi/mod assist.    Baseline: mod assist Goal status: ONGOING   2.  Pt will describe objects and pictures by providing at least three salient features as judged by clinician with 90% acc when provided mi/mod cues. Baseline: 75% Goal status: ONGOING   3.  Pt will utilize external memory strategies in home and community environment to increase safety and recall with mi/mod cues and caregiver education. Baseline: needs to be set up Goal status: ONGOING   4.  Pt will verbally express thoughts by verbalizing sentences with mi/mod prompts from SLP/caregiver to help Pt stay on topic (word prompts, written prompts) with 90% effectiveness. Baseline: mod/max assist to guide the conversation Goal status: ONGOING     LONG TERM GOALS: Same as short term goals   ASSESSMENT:   CLINICAL IMPRESSION: (from initial evaluation) Patient is a 72 y.o. female who was seen today for a cognitive linguistic evaluation due to mild cognitive impairment per neurology.  Pt scored a 12/30 on the [facility] (see above) with relative strength in answering questions regarding a short store (8/8). She was only able to name 3 animals in 60 seconds, unable to complete mental calculations (but did complete addition, subtraction, and multiplication accurately with written cues), and unable to complete delayed recall task (2/5 for immediate recall and 0/5 for delayed recall). During the clock drawing task, she was able to write 12 (and few extra with repetition) numbers around the clock, but no hands. She achieved 13/15 on the [place] short form. She completed a picture description task [place] with relative strength in content words (husband reports these are often lacking at home). During conversation, Pt appeared to frequently lose her train of thought and responded well to SLP cues for guided conversation (will reinforce with written cues next session). Recommend short term SLP therapy to provide Pt and family education on how to improve communication at home and increase safety for the future given her memory deficits. Pt and spouse are in agreement with plan of care.    OBJECTIVE IMPAIRMENTS: include attention, memory, executive functioning, expressive  language, and receptive language. These impairments are limiting patient from return to work, managing medications, managing appointments, managing finances, and effectively communicating at home and in community. Factors affecting potential to achieve goals and functional outcome are ability to learn/carryover information, medical prognosis, and previous level of function. Patient will benefit from skilled SLP services to address above impairments and improve overall function.   REHAB POTENTIAL: Good   PLAN:   SLP FREQUENCY: 1x/week   SLP DURATION: 5 weeks   PLANNED INTERVENTIONS: Language facilitation, Environmental controls, Cueing hierachy, Cognitive reorganization, Internal/external aids,  Functional tasks, Multimodal communication approach, SLP instruction and feedback, Compensatory strategies, and Patient/family education      TODAY'S TREATMENT: Pt accompanied to SLP therapy by her husband, JW. He reported that she still has occasions of confusion later in the day and evenings where she talks about JW in the third person and doesn't recognize her house. He purchased a dry erase board as suggested last week and wrote down basic schedule for the day. He asked SLP to read the last neurology note to see if he is supposed to re-start the Rivastigmine. SLP sent a message to Sharene Butters, who confirmed that they could start it. SLP provided education to Pt and spouse regarding setting up a "memory station" in the house to include daily schedule, clock, notebook, and medication log. This was also provided in written form for husband to review at home. Pt engaged in conversation during session, but required frequent cues to maintain "train of thought". She continues to benefit from written cues. Next session, provide ongoing education on ways to set up the environment at home to decrease anxiety and communication breakdowns due to memory deficits.                                                                                                                                        DATE: 10/21/22    Thank you,   Genene Churn, [place]  [place], [place] 10/21/2022, 11:53 AM

## 2022-10-28 ENCOUNTER — Encounter (HOSPITAL_COMMUNITY): Payer: PPO | Admitting: Speech Pathology

## 2022-10-29 ENCOUNTER — Telehealth: Payer: Self-pay | Admitting: Physician Assistant

## 2022-10-29 NOTE — Telephone Encounter (Signed)
Patient advised to call back with update.

## 2022-10-29 NOTE — Telephone Encounter (Signed)
Patients husband called to let sara know since starting back on rivastigmine 4.57m.she has started throwing up. She has thrown up 5x in 3 days

## 2022-11-04 ENCOUNTER — Encounter (HOSPITAL_COMMUNITY): Payer: Self-pay | Admitting: Speech Pathology

## 2022-11-04 ENCOUNTER — Ambulatory Visit (HOSPITAL_COMMUNITY): Payer: PPO | Admitting: Speech Pathology

## 2022-11-04 DIAGNOSIS — R41841 Cognitive communication deficit: Secondary | ICD-10-CM

## 2022-11-04 NOTE — Therapy (Signed)
OUTPATIENT SPEECH LANGUAGE PATHOLOGY TREATMENT NOTE   Patient Name: Natalie Morrow MRN: [id number] DOB:05/17/1951, 72 y.o., female Today's Date: 11/04/2022  PCP: Manon Hilding, MD REFERRING PROVIDER: Rondel Jumbo, PA-C  END OF SESSION:   End of Session - 11/04/22 1136     Visit Number 5    Number of Visits 6    Date for SLP Re-Evaluation 11/05/22    Authorization Type [employer]   eff date 09/14/22 oop max 3200 met -0 ded-0 visit limit-no auth-no copay$15.00 co ins-0   SLP Start Time 1030    SLP Stop Time  1115    SLP Time Calculation (min) 45 min    Activity Tolerance Patient tolerated treatment well             Past Medical History:  Diagnosis Date   AK (actinic keratosis) 09/17/2015   Arthritis    Bulging lumbar disc    Bursitis of hip    Fibromyalgia    GERD (gastroesophageal reflux disease)    Hormone replacement therapy (HRT) 09/05/2013   Hypercholesteremia    Hypothyroidism    Plantar fasciitis, bilateral    Rosacea    Past Surgical History:  Procedure Laterality Date   BACK SURGERY  05/2015   CHOLECYSTECTOMY     COLONOSCOPY N/A 11/10/2012   Procedure: COLONOSCOPY;  Surgeon: Rogene Houston, MD;  [facility];  Service: Endoscopy;  Laterality: N/A;  730   COLONOSCOPY WITH PROPOFOL N/A 04/08/2018   Procedure: COLONOSCOPY WITH PROPOFOL;  Surgeon: Rogene Houston, MD;  [facility];  Service: Endoscopy;  Laterality: N/A;  100   TONSILLECTOMY     Patient Active Problem List   Diagnosis Date Noted   Memory impairment 08/29/2022   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[place]) 06/18/2022   Cognitive dysfunction 11/04/2020   Screening for colorectal cancer 12/07/2019   Encounter for well woman exam with routine gynecological exam 12/07/2019   Family hx of colon cancer 02/17/2018   Rectocele 10/12/2017   Well female exam with routine gynecological exam 10/12/2017   Vaginal dryness 10/12/2017   AK (actinic  keratosis) 09/17/2015    ONSET DATE: 08/28/2022    REFERRING DIAG: G30.9,F06.70 (ICD-10-CM) - 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[place])   THERAPY DIAG:  Cognitive communication deficit   Rationale for Evaluation and Treatment: Rehabilitation  PERTINENT HISTORY: DAMANI BARDIN is a very pleasant 72 y.o. left handed female who was referred for SLP evaluation and treatment by Sharene Butters, NP (neurology). Neuropsychological testing on 10/28/2020 showed mild cognitive deficits in memory and executive function but did not meet criteria for dementia.  PET scan of brain on 03/10/2021 showed decreased relative cortical metabolism within the left parietal lobe as may be seen in Alzheimer's type pathology. Patient was recently taken off rivastigmine 4.5 mg bid, due to possible increasing tremors and dizziness. Pt and spouse currently report that tremor and dizziness have improved, but that word finding appears worse. Most recent EEG was negative for seizure activity. Neurology noted some parkinsonian features during exam, such as hypomimia, R postural and intention tremor, shorter stride, gait is normal. She also appears to have some involuntary R arm spasmodic movements, mild. Latest MRI brain is remarkable for minimal chronic small vessel disease ( similar to prior MRI 02/02/21), stable mild generalized atrophy. She will be going for a DaT scan next week. Pt reports feeling "sad" about her changes in memory and would like to "get back  to normal". She no longer drives or cooks, but can stay at home for a few hours alone while her husband, [name], runs errands. She does not leave the house very often, but does have some friends who come by to visit. She has a son, [name], who lives in [place] and grandson [name], [place]). She last was employed at the [employer]. She enjoys watching westerns with her husband and the [employer] channel.  Other: DaT scan:  <<FINDINGS: Decreased radiotracer  activity within the LEFT striatum with near absent loss in the LEFT putamen and decreased activity in the head of the LEFT caudate nucleus.   Decreased radiotracer activity in the RIGHT putamen and relative the maintained activity in the head of the RIGHT caudate nucleus.   IMPRESSION: Decreased radiotracer activity in the LEFT and RIGHT striatum in aa pattern suggestive of Parkinson's syndrome pathology. Greater deficit on the LEFT.   Of note, DaTSCAN is not diagnostic of Parkinsonian syndromes, which remains a clinical diagnosis. DaTscan is an adjuvant test to aid in the clinical diagnosis of Parkinsonian syndromes.>>  SUBJECTIVE:   SUBJECTIVE STATEMENT: "I guess I don't know."   PAIN:  Are you having pain? No   OBJECTIVE:  GOALS: Goals reviewed with patient? Yes   SHORT TERM GOALS: Target date: 11/05/2022   Pt will implement word-finding strategies with 90% accuracy when unable to verbalize desired word in conversation/functional tasks with mi/mod assist.    Baseline: mod assist Goal status: ONGOING   2.  Pt will describe objects and pictures by providing at least three salient features as judged by clinician with 90% acc when provided mi/mod cues. Baseline: 75% Goal status: ONGOING   3.  Pt will utilize external memory strategies in home and community environment to increase safety and recall with mi/mod cues and caregiver education. Baseline: needs to be set up Goal status: ONGOING   4.  Pt will verbally express thoughts by verbalizing sentences with mi/mod prompts from SLP/caregiver to help Pt stay on topic (word prompts, written prompts) with 90% effectiveness. Baseline: mod/max assist to guide the conversation Goal status: ONGOING     LONG TERM GOALS: Same as short term goals   ASSESSMENT:   CLINICAL IMPRESSION: (from initial evaluation) Patient is a 72 y.o. female who was seen today for a cognitive linguistic evaluation due to mild cognitive impairment per  neurology. Pt scored a 12/30 on the [facility] (see above) with relative strength in answering questions regarding a short store (8/8). She was only able to name 3 animals in 60 seconds, unable to complete mental calculations (but did complete addition, subtraction, and multiplication accurately with written cues), and unable to complete delayed recall task (2/5 for immediate recall and 0/5 for delayed recall). During the clock drawing task, she was able to write 12 (and few extra with repetition) numbers around the clock, but no hands. She achieved 13/15 on the [place] short form. She completed a picture description task ([place] with relative strength in content words (husband reports these are often lacking at home). During conversation, Pt appeared to frequently lose her train of thought and responded well to SLP cues for guided conversation (will reinforce with written cues next session). Recommend short term SLP therapy to provide Pt and family education on how to improve communication at home and increase safety for the future given her memory deficits. Pt and spouse are in agreement with plan of care.    OBJECTIVE IMPAIRMENTS: include attention,  memory, executive functioning, expressive language, and receptive language. These impairments are limiting patient from return to work, managing medications, managing appointments, managing finances, and effectively communicating at home and in community. Factors affecting potential to achieve goals and functional outcome are ability to learn/carryover information, medical prognosis, and previous level of function. Patient will benefit from skilled SLP services to address above impairments and improve overall function.   REHAB POTENTIAL: Good   PLAN:   SLP FREQUENCY: 1x/week   SLP DURATION: 5 weeks   PLANNED INTERVENTIONS: Language facilitation, Environmental controls, Cueing hierachy, Cognitive reorganization,  Internal/external aids, Functional tasks, Multimodal communication approach, SLP instruction and feedback, Compensatory strategies, and Patient/family education      TODAY'S TREATMENT: Pt accompanied to SLP therapy by her husband, JW. He indicates increased evening confusion to the point of agitation. SLP discussed possible strategies to implement via redirection which include: take a short walk outside before dark, get up from the den and go to the kitchen to have a snack/drink, go to the bathroom, fold laundry/hand towels, switch from TV to listening to music, and card sorting. They were encouraged to stick to a routine (Wake up 7AM, thyroid pill, lay back down, breakfast and medications between 8-9AM, lunch around 12:30 and medications, supper around 5PM and medications, and bedtime between 9-10PM). In session, SLP provided Pt with UNO cards and regular playing cards and had Pt sort cards by color and number (UNO) and then by suit and and chronological order with the regular playing cards. Pt was able to maintain card sorting task for 15 minutes and needed min cues for accuracy. She continues to have difficulty completing verbal thoughts aloud despite cues from SLP. Next session, see how changes at home are implemented and continue to focus on providing strategies for communication and environmental controls at home for memory changes.                                                                                                                                      DATE: 11/04/22    Thank you,   Genene Churn, [place]  [place], [place] 11/04/2022, 11:37 AM

## 2022-11-09 ENCOUNTER — Encounter (HOSPITAL_COMMUNITY): Payer: PPO | Admitting: Speech Pathology

## 2022-11-11 ENCOUNTER — Ambulatory Visit (HOSPITAL_COMMUNITY): Payer: PPO | Admitting: Speech Pathology

## 2022-11-11 ENCOUNTER — Encounter (HOSPITAL_COMMUNITY): Payer: Self-pay | Admitting: Speech Pathology

## 2022-11-11 DIAGNOSIS — R41841 Cognitive communication deficit: Secondary | ICD-10-CM

## 2022-11-11 NOTE — Therapy (Signed)
OUTPATIENT SPEECH LANGUAGE PATHOLOGY TREATMENT NOTE   Patient Name: Natalie Morrow MRN: [id number] DOB:1950-11-07, 72 y.o., female Today's Date: 11/11/2022  PCP: Manon Hilding, MD REFERRING PROVIDER: Rondel Jumbo, PA-C  END OF SESSION:   End of Session - 11/11/22 1210     Visit Number 6    Number of Visits 10    Date for SLP Re-Evaluation 12/17/22    Authorization Type [employer]   eff date 09/14/22 oop max 3200 met -0 ded-0 visit limit-no auth-no copay$15.00 co ins-0   SLP Start Time 1030    SLP Stop Time  1115    SLP Time Calculation (min) 45 min    Activity Tolerance Patient tolerated treatment well             Past Medical History:  Diagnosis Date   AK (actinic keratosis) 09/17/2015   Arthritis    Bulging lumbar disc    Bursitis of hip    Fibromyalgia    GERD (gastroesophageal reflux disease)    Hormone replacement therapy (HRT) 09/05/2013   Hypercholesteremia    Hypothyroidism    Plantar fasciitis, bilateral    Rosacea    Past Surgical History:  Procedure Laterality Date   BACK SURGERY  05/2015   CHOLECYSTECTOMY     COLONOSCOPY N/A 11/10/2012   Procedure: COLONOSCOPY;  Surgeon: Rogene Houston, MD;  [facility];  Service: Endoscopy;  Laterality: N/A;  730   COLONOSCOPY WITH PROPOFOL N/A 04/08/2018   Procedure: COLONOSCOPY WITH PROPOFOL;  Surgeon: Rogene Houston, MD;  Location: [facility];  Service: Endoscopy;  Laterality: N/A;  100   TONSILLECTOMY     Patient Active Problem List   Diagnosis Date Noted   Memory impairment 08/29/2022   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[place]) 06/18/2022   Cognitive dysfunction 11/04/2020   Screening for colorectal cancer 12/07/2019   Encounter for well woman exam with routine gynecological exam 12/07/2019   Family hx of colon cancer 02/17/2018   Rectocele 10/12/2017   Well female exam with routine gynecological exam 10/12/2017   Vaginal dryness 10/12/2017   AK  (actinic keratosis) 09/17/2015    ONSET DATE: 08/28/2022    REFERRING DIAG: G30.9,F06.70 (ICD-10-CM) - 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[place])   THERAPY DIAG:  Cognitive communication deficit   Rationale for Evaluation and Treatment: Rehabilitation  PERTINENT HISTORY: Natalie Morrow is a very pleasant 72 y.o. left handed female who was referred for SLP evaluation and treatment by Sharene Butters, NP (neurology). Neuropsychological testing on 10/28/2020 showed mild cognitive deficits in memory and executive function but did not meet criteria for dementia.  PET scan of brain on 03/10/2021 showed decreased relative cortical metabolism within the left parietal lobe as may be seen in Alzheimer's type pathology. Patient was recently taken off rivastigmine 4.5 mg bid, due to possible increasing tremors and dizziness. Pt and spouse currently report that tremor and dizziness have improved, but that word finding appears worse. Most recent EEG was negative for seizure activity. Neurology noted some parkinsonian features during exam, such as hypomimia, R postural and intention tremor, shorter stride, gait is normal. She also appears to have some involuntary R arm spasmodic movements, mild. Latest MRI brain is remarkable for minimal chronic small vessel disease ( similar to prior MRI 02/02/21), stable mild generalized atrophy. She will be going for a DaT scan next week. Pt reports feeling "sad" about her changes in memory and would like to "get back  to normal". She no longer drives or cooks, but can stay at home for a few hours alone while her husband, [name], runs errands. She does not leave the house very often, but does have some friends who come by to visit. She has a son, [name], who lives in [place] and grandson [name], [place]). She last was employed at the [employer]. She enjoys watching westerns with her husband and the [employer] channel.  Other: DaT scan:  <<FINDINGS: Decreased  radiotracer activity within the LEFT striatum with near absent loss in the LEFT putamen and decreased activity in the head of the LEFT caudate nucleus.   Decreased radiotracer activity in the RIGHT putamen and relative the maintained activity in the head of the RIGHT caudate nucleus.   IMPRESSION: Decreased radiotracer activity in the LEFT and RIGHT striatum in aa pattern suggestive of Parkinson's syndrome pathology. Greater deficit on the LEFT.   Of note, DaTSCAN is not diagnostic of Parkinsonian syndromes, which remains a clinical diagnosis. DaTscan is an adjuvant test to aid in the clinical diagnosis of Parkinsonian syndromes.>>  SUBJECTIVE:   SUBJECTIVE STATEMENT: "It makes me sad."   PAIN:  Are you having pain? No   OBJECTIVE:  GOALS: Goals reviewed with patient? Yes   SHORT TERM GOALS: Target date: 12/17/2022   Pt will implement word-finding strategies with 90% accuracy when unable to verbalize desired word in conversation/functional tasks with mi/mod assist.    Baseline: mod assist Goal status: partially met/continue   2.  Pt will describe objects and pictures by providing at least three salient features as judged by clinician with 90% acc when provided mi/mod cues. Baseline: 75% Goal status: partially met/continue   3.  Pt will utilize external memory strategies in home and community environment to increase safety and recall with mi/mod cues and caregiver education. Baseline: needs to be set up Goal status: partially met/continue   4.  Pt will verbally express thoughts by verbalizing sentences with mi/mod prompts from SLP/caregiver to help Pt stay on topic (word prompts, written prompts) with 90% effectiveness. Baseline: mod/max assist to guide the conversation Goal status: partially met/continue     LONG TERM GOALS: Same as short term goals   ASSESSMENT:   CLINICAL IMPRESSION: (from initial evaluation) Patient is a 72 y.o. female who was seen today for a  cognitive linguistic evaluation due to mild cognitive impairment per neurology. Pt scored a 12/30 on the [facility] (see above) with relative strength in answering questions regarding a short store (8/8). She was only able to name 3 animals in 60 seconds, unable to complete mental calculations (but did complete addition, subtraction, and multiplication accurately with written cues), and unable to complete delayed recall task (2/5 for immediate recall and 0/5 for delayed recall). During the clock drawing task, she was able to write 12 (and few extra with repetition) numbers around the clock, but no hands. She achieved 13/15 on the [place] short form. She completed a picture description task [place] with relative strength in content words (husband reports these are often lacking at home). During conversation, Pt appeared to frequently lose her train of thought and responded well to SLP cues for guided conversation (will reinforce with written cues next session). Recommend short term SLP therapy to provide Pt and family education on how to improve communication at home and increase safety for the future given her memory deficits. Pt and spouse are in agreement with plan of care.    OBJECTIVE  IMPAIRMENTS: include attention, memory, executive functioning, expressive language, and receptive language. These impairments are limiting patient from return to work, managing medications, managing appointments, managing finances, and effectively communicating at home and in community. Factors affecting potential to achieve goals and functional outcome are ability to learn/carryover information, medical prognosis, and previous level of function. Patient will benefit from skilled SLP services to address above impairments and improve overall function.   REHAB POTENTIAL: Good   PLAN:   SLP FREQUENCY: 1x/week   SLP DURATION: 4 weeks   PLANNED INTERVENTIONS: Language facilitation,  Environmental controls, Cueing hierachy, Cognitive reorganization, Internal/external aids, Functional tasks, Multimodal communication approach, SLP instruction and feedback, Compensatory strategies, and Patient/family education      TODAY'S TREATMENT: Pt accompanied to SLP therapy by her husband, JW. He continues to report evening confusion and agitation. She indicates that she is ready to leave and go home, despite being at home. JW stated that he tried some of the strategies recommended during the previous session with some success. He took her on a drive and then brought her back home. It appears that [place] could use more support, however he stated that he does not like the idea of someone coming into the home to help. SLP suggested that he reach out to his son to see if they can come visit on a regular basis (they are in [place]). SLP created a script for Pt to read in the evenings when she becomes confused and provided it in a sheet protector to take home. This included some of the following: I am at home with my husband, JW. Sometimes I become confused in the evenings, but I am safe and who I am supposed to be with (JW), and challenges over getting words out. SLP then wrote down some of the comments Pt stated today: I have a hard time getting my words out, I start pointing to things, the difficulty with my memory started in 2021, it makes me feel sad, I am taking medicine to try and help me, etc. Pt also indicated that she knows what she wants to say, but then her mind goes blank. Pt and spouse report that they like to come therapy and are appreciative of strategies recommended. Both express that they would like to keep coming to therapy. Pt made progress toward goals, but more time needs to be spent on increasing communication strategies at home (most sessions spent on ways to set up the environment at home given memory and confusion). Recommend 4 more weeks of therapy with weekly sessions to focus on  increasing communication strategies. Pt and spouse are in agreement with plan of care.                                                                                                                                     DATE: 11/11/22    Thank you,   Genene Churn, [place],  CCC-SLP 11/11/2022, 12:12 PM

## 2022-11-12 ENCOUNTER — Encounter: Payer: Self-pay | Admitting: Physician Assistant

## 2022-11-12 ENCOUNTER — Other Ambulatory Visit: Payer: Self-pay | Admitting: Physician Assistant

## 2022-11-12 MED ORDER — MEMANTINE HCL 5 MG PO TABS: ORAL_TABLET | ORAL | 60 refills | Status: DC

## 2022-11-12 NOTE — Telephone Encounter (Signed)
Patient's husband called in stating they stopped the medication, wondering when she can try to go back on it or if there is another medication that needs to be called in.

## 2022-11-16 ENCOUNTER — Encounter (HOSPITAL_COMMUNITY): Payer: PPO | Admitting: Speech Pathology

## 2022-11-17 DIAGNOSIS — M797 Fibromyalgia: Secondary | ICD-10-CM | POA: Diagnosis not present

## 2022-11-17 DIAGNOSIS — N183 Chronic kidney disease, stage 3 unspecified: Secondary | ICD-10-CM | POA: Diagnosis not present

## 2022-11-17 DIAGNOSIS — E038 Other specified hypothyroidism: Secondary | ICD-10-CM | POA: Diagnosis not present

## 2022-11-17 DIAGNOSIS — N1831 Chronic kidney disease, stage 3a: Secondary | ICD-10-CM | POA: Diagnosis not present

## 2022-11-17 DIAGNOSIS — E039 Hypothyroidism, unspecified: Secondary | ICD-10-CM | POA: Diagnosis not present

## 2022-11-17 DIAGNOSIS — Z0001 Encounter for general adult medical examination with abnormal findings: Secondary | ICD-10-CM | POA: Diagnosis not present

## 2022-11-17 DIAGNOSIS — E7849 Other hyperlipidemia: Secondary | ICD-10-CM | POA: Diagnosis not present

## 2022-11-18 ENCOUNTER — Ambulatory Visit (HOSPITAL_COMMUNITY): Payer: PPO | Attending: Physician Assistant | Admitting: Speech Pathology

## 2022-11-18 ENCOUNTER — Encounter (HOSPITAL_COMMUNITY): Payer: Self-pay | Admitting: Speech Pathology

## 2022-11-18 DIAGNOSIS — R41841 Cognitive communication deficit: Secondary | ICD-10-CM | POA: Diagnosis not present

## 2022-11-18 NOTE — Therapy (Signed)
OUTPATIENT SPEECH LANGUAGE PATHOLOGY TREATMENT NOTE   Patient Name: Natalie Morrow MRN: [id number] DOB:1951/04/26, 72 y.o., female Today's Date: 11/18/2022  PCP: Natalie Hilding, MD REFERRING PROVIDER: Rondel Jumbo, PA-C  END OF SESSION:   End of Session - 11/18/22 1146     Visit Number 7    Number of Visits 10    Date for SLP Re-Evaluation 12/17/22    Authorization Type [employer]   eff date 09/14/22 oop max 3200 met -0 ded-0 visit limit-no auth-no copay$15.00 co ins-0   SLP Start Time 1030    SLP Stop Time  1115    SLP Time Calculation (min) 45 min    Activity Tolerance Patient tolerated treatment well             Past Medical History:  Diagnosis Date   AK (actinic keratosis) 09/17/2015   Arthritis    Bulging lumbar disc    Bursitis of hip    Fibromyalgia    GERD (gastroesophageal reflux disease)    Hormone replacement therapy (HRT) 09/05/2013   Hypercholesteremia    Hypothyroidism    Plantar fasciitis, bilateral    Rosacea    Past Surgical History:  Procedure Laterality Date   BACK SURGERY  05/2015   CHOLECYSTECTOMY     COLONOSCOPY N/A 11/10/2012   Procedure: COLONOSCOPY;  Surgeon: Natalie Houston, MD;  [facility];  Service: Endoscopy;  Laterality: N/A;  730   COLONOSCOPY WITH PROPOFOL N/A 04/08/2018   Procedure: COLONOSCOPY WITH PROPOFOL;  Surgeon: Natalie Houston, MD;  [facility];  Service: Endoscopy;  Laterality: N/A;  100   TONSILLECTOMY     Patient Active Problem List   Diagnosis Date Noted   Memory impairment 08/29/2022   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[place]) 06/18/2022   Cognitive dysfunction 11/04/2020   Screening for colorectal cancer 12/07/2019   Encounter for well woman exam with routine gynecological exam 12/07/2019   Family hx of colon cancer 02/17/2018   Rectocele 10/12/2017   Well female exam with routine gynecological exam 10/12/2017   Vaginal dryness 10/12/2017   AK (actinic  keratosis) 09/17/2015    ONSET DATE: 08/28/2022    REFERRING DIAG: G30.9,F06.70 (ICD-10-CM) - 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[place])   THERAPY DIAG:  Cognitive communication deficit   Rationale for Evaluation and Treatment: Rehabilitation  PERTINENT HISTORY: Natalie Morrow is a very pleasant 72 y.o. left handed female who was referred for SLP evaluation and treatment by Natalie Butters, NP (neurology). Neuropsychological testing on 10/28/2020 showed mild cognitive deficits in memory and executive function but did not meet criteria for dementia.  PET scan of brain on 03/10/2021 showed decreased relative cortical metabolism within the left parietal lobe as may be seen in Alzheimer's type pathology. Patient was recently taken off rivastigmine 4.5 mg bid, due to possible increasing tremors and dizziness. Pt and spouse currently report that tremor and dizziness have improved, but that word finding appears worse. Most recent EEG was negative for seizure activity. Neurology noted some parkinsonian features during exam, such as hypomimia, R postural and intention tremor, shorter stride, gait is normal. She also appears to have some involuntary R arm spasmodic movements, mild. Latest MRI brain is remarkable for minimal chronic small vessel disease ( similar to prior MRI 02/02/21), stable mild generalized atrophy. She will be going for a DaT scan next week. Pt reports feeling "sad" about her changes in memory and would like to "get back  to normal". She no longer drives or cooks, but can stay at home for a few hours alone while her husband, [name], runs errands. She does not leave the house very often, but does have some friends who come by to visit. She has a son, Natalie Morrow, who lives in [place] and grandson Natalie Morrow, [place]). She last was employed at the [employer]. She enjoys watching westerns with her husband and the [employer] channel.  Other: DaT scan:  <<FINDINGS: Decreased radiotracer  activity within the LEFT striatum with near absent loss in the LEFT putamen and decreased activity in the head of the LEFT caudate nucleus.   Decreased radiotracer activity in the RIGHT putamen and relative the maintained activity in the head of the RIGHT caudate nucleus.   IMPRESSION: Decreased radiotracer activity in the LEFT and RIGHT striatum in aa pattern suggestive of Parkinson's syndrome pathology. Greater deficit on the LEFT.   Of note, DaTSCAN is not diagnostic of Parkinsonian syndromes, which remains a clinical diagnosis. DaTscan is an adjuvant test to aid in the clinical diagnosis of Parkinsonian syndromes.>>  SUBJECTIVE:   SUBJECTIVE STATEMENT: "It makes me sad."   PAIN:  Are you having pain? No   OBJECTIVE:  GOALS: Goals reviewed with patient? Yes   SHORT TERM GOALS: Target date: 12/17/2022   Pt will implement word-finding strategies with 90% accuracy when unable to verbalize desired word in conversation/functional tasks with mi/mod assist.    Baseline: mod assist Goal status: partially met/continue   2.  Pt will describe objects and pictures by providing at least three salient features as judged by clinician with 90% acc when provided mi/mod cues. Baseline: 75% Goal status: partially met/continue   3.  Pt will utilize external memory strategies in home and community environment to increase safety and recall with mi/mod cues and caregiver education. Baseline: needs to be set up Goal status: partially met/continue   4.  Pt will verbally express thoughts by verbalizing sentences with mi/mod prompts from SLP/caregiver to help Pt stay on topic (word prompts, written prompts) with 90% effectiveness. Baseline: mod/max assist to guide the conversation Goal status: partially met/continue     LONG TERM GOALS: Same as short term goals   ASSESSMENT:   CLINICAL IMPRESSION: (from initial evaluation) Patient is a 72 y.o. female who was seen today for a cognitive  linguistic evaluation due to mild cognitive impairment per neurology. Pt scored a 12/30 on the [facility] (see above) with relative strength in answering questions regarding a short store (8/8). She was only able to name 3 animals in 60 seconds, unable to complete mental calculations (but did complete addition, subtraction, and multiplication accurately with written cues), and unable to complete delayed recall task (2/5 for immediate recall and 0/5 for delayed recall). During the clock drawing task, she was able to write 12 (and few extra with repetition) numbers around the clock, but no hands. She achieved 13/15 on the [place] short form. She completed a picture description task ([place] with relative strength in content words (husband reports these are often lacking at home). During conversation, Pt appeared to frequently lose her train of thought and responded well to SLP cues for guided conversation (will reinforce with written cues next session). Recommend short term SLP therapy to provide Pt and family education on how to improve communication at home and increase safety for the future given her memory deficits. Pt and spouse are in agreement with plan of care.    OBJECTIVE  IMPAIRMENTS: include attention, memory, executive functioning, expressive language, and receptive language. These impairments are limiting patient from return to work, managing medications, managing appointments, managing finances, and effectively communicating at home and in community. Factors affecting potential to achieve goals and functional outcome are ability to learn/carryover information, medical prognosis, and previous level of function. Patient will benefit from skilled SLP services to address above impairments and improve overall function.   REHAB POTENTIAL: Good   PLAN:   SLP FREQUENCY: 1x/week   SLP DURATION: 4 weeks   PLANNED INTERVENTIONS: Language facilitation, Environmental  controls, Cueing hierachy, Cognitive reorganization, Internal/external aids, Functional tasks, Multimodal communication approach, SLP instruction and feedback, Compensatory strategies, and Patient/family education      TODAY'S TREATMENT: Pt accompanied to SLP therapy by her husband, JW. He continues to report evening confusion and agitation. He called the neurologist and Pt was started on Nam[place] (11/14/22). He wonders whether the Sinemet is making evening confusion worse. They have an appointment on 12/07/22. Pt completed picture description task (cued to provide "who" and "what doing") when looking at action photos. She provided adequate description with 100% acc when provided min/mod cues. She occasionally substituted words (stewardess for little girl) and provided vague/genera responses. When she has a photo in front of her, she is more easily able to verbalize her thoughts. She has greater difficulty talking about her thoughts and feelings when she does not have something concrete to see (telling me about something that happened before therapy). Their son is coming to visit for a few days and I asked them to review the information shared in SLP sessions and asked that they go purchase a small 3-ring binder to keep her SLP information in and some photo cards in the education section. JW continues to try and use diversion tactics at home when she becomes anxious- taking a short drive in the car, taking a shower, and changing the activity. He reports that this is somewhat effective, however he appears quite fatigued. Next session, personalize her notebook to facilitate communication and memory.    DATE: 11/18/22    Thank you,   Genene Churn, [place], [place] 11/18/2022, 11:47 AM

## 2022-11-24 DIAGNOSIS — Z23 Encounter for immunization: Secondary | ICD-10-CM | POA: Diagnosis not present

## 2022-11-24 DIAGNOSIS — F0390 Unspecified dementia without behavioral disturbance: Secondary | ICD-10-CM | POA: Diagnosis not present

## 2022-11-24 DIAGNOSIS — E039 Hypothyroidism, unspecified: Secondary | ICD-10-CM | POA: Diagnosis not present

## 2022-11-24 DIAGNOSIS — E7849 Other hyperlipidemia: Secondary | ICD-10-CM | POA: Diagnosis not present

## 2022-11-24 DIAGNOSIS — Z1212 Encounter for screening for malignant neoplasm of rectum: Secondary | ICD-10-CM | POA: Diagnosis not present

## 2022-11-24 DIAGNOSIS — N1831 Chronic kidney disease, stage 3a: Secondary | ICD-10-CM | POA: Diagnosis not present

## 2022-11-24 DIAGNOSIS — R030 Elevated blood-pressure reading, without diagnosis of hypertension: Secondary | ICD-10-CM | POA: Diagnosis not present

## 2022-11-24 DIAGNOSIS — G20A1 Parkinson's disease without dyskinesia, without mention of fluctuations: Secondary | ICD-10-CM | POA: Diagnosis not present

## 2022-11-24 DIAGNOSIS — Z800 Family history of malignant neoplasm of digestive organs: Secondary | ICD-10-CM | POA: Diagnosis not present

## 2022-11-24 DIAGNOSIS — Z0001 Encounter for general adult medical examination with abnormal findings: Secondary | ICD-10-CM | POA: Diagnosis not present

## 2022-11-24 DIAGNOSIS — Z681 Body mass index (BMI) 19 or less, adult: Secondary | ICD-10-CM | POA: Diagnosis not present

## 2022-11-25 ENCOUNTER — Encounter (HOSPITAL_COMMUNITY): Payer: Self-pay | Admitting: Speech Pathology

## 2022-11-25 ENCOUNTER — Ambulatory Visit (HOSPITAL_COMMUNITY): Payer: PPO | Admitting: Speech Pathology

## 2022-11-25 DIAGNOSIS — R41841 Cognitive communication deficit: Secondary | ICD-10-CM

## 2022-11-25 NOTE — Therapy (Signed)
OUTPATIENT SPEECH LANGUAGE PATHOLOGY TREATMENT NOTE   Patient Name: Natalie Morrow MRN: [id number] DOB:05-Apr-1951, 72 y.o., female Today's Date: 11/25/2022  PCP: Manon Hilding, MD REFERRING PROVIDER: Rondel Jumbo, PA-C  END OF SESSION:   End of Session - 11/25/22 1133     Visit Number 8    Number of Visits 10    Date for SLP Re-Evaluation 12/17/22    Authorization Type [employer]   eff date 09/14/22 oop max 3200 met -0 ded-0 visit limit-no auth-no copay$15.00 co ins-0   SLP Start Time 0955    SLP Stop Time  1045    SLP Time Calculation (min) 50 min    Activity Tolerance Patient tolerated treatment well             Past Medical History:  Diagnosis Date   AK (actinic keratosis) 09/17/2015   Arthritis    Bulging lumbar disc    Bursitis of hip    Fibromyalgia    GERD (gastroesophageal reflux disease)    Hormone replacement therapy (HRT) 09/05/2013   Hypercholesteremia    Hypothyroidism    Plantar fasciitis, bilateral    Rosacea    Past Surgical History:  Procedure Laterality Date   BACK SURGERY  05/2015   CHOLECYSTECTOMY     COLONOSCOPY N/A 11/10/2012   Procedure: COLONOSCOPY;  Surgeon: Rogene Houston, MD;  [facility];  Service: Endoscopy;  Laterality: N/A;  730   COLONOSCOPY WITH PROPOFOL N/A 04/08/2018   Procedure: COLONOSCOPY WITH PROPOFOL;  Surgeon: Rogene Houston, MD;  [facility];  Service: Endoscopy;  Laterality: N/A;  100   TONSILLECTOMY     Patient Active Problem List   Diagnosis Date Noted   Memory impairment 08/29/2022   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[place]) 06/18/2022   Cognitive dysfunction 11/04/2020   Screening for colorectal cancer 12/07/2019   Encounter for well woman exam with routine gynecological exam 12/07/2019   Family hx of colon cancer 02/17/2018   Rectocele 10/12/2017   Well female exam with routine gynecological exam 10/12/2017   Vaginal dryness 10/12/2017   AK  (actinic keratosis) 09/17/2015    ONSET DATE: 08/28/2022    REFERRING DIAG: G30.9,F06.70 (ICD-10-CM) - 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[place])   THERAPY DIAG:  Cognitive communication deficit   Rationale for Evaluation and Treatment: Rehabilitation  PERTINENT HISTORY: Natalie Morrow is a very pleasant 72 y.o. left handed female who was referred for SLP evaluation and treatment by Sharene Butters, NP (neurology). Neuropsychological testing on 10/28/2020 showed mild cognitive deficits in memory and executive function but did not meet criteria for dementia.  PET scan of brain on 03/10/2021 showed decreased relative cortical metabolism within the left parietal lobe as may be seen in Alzheimer's type pathology. Patient was recently taken off rivastigmine 4.5 mg bid, due to possible increasing tremors and dizziness. Pt and spouse currently report that tremor and dizziness have improved, but that word finding appears worse. Most recent EEG was negative for seizure activity. Neurology noted some parkinsonian features during exam, such as hypomimia, R postural and intention tremor, shorter stride, gait is normal. She also appears to have some involuntary R arm spasmodic movements, mild. Latest MRI brain is remarkable for minimal chronic small vessel disease ( similar to prior MRI 02/02/21), stable mild generalized atrophy. She will be going for a DaT scan next week. Pt reports feeling "sad" about her changes in memory and would like to "get back  to normal". She no longer drives or cooks, but can stay at home for a few hours alone while her husband, [name], runs errands. She does not leave the house very often, but does have some friends who come by to visit. She has a son, Natalie Morrow, who lives in [place] and grandson Natalie Morrow, Natalie Morrow). She last was employed at the [employer]. She enjoys watching westerns with her husband and the [employer] channel.  Other: DaT scan:  <<FINDINGS: Decreased  radiotracer activity within the LEFT striatum with near absent loss in the LEFT putamen and decreased activity in the head of the LEFT caudate nucleus.   Decreased radiotracer activity in the RIGHT putamen and relative the maintained activity in the head of the RIGHT caudate nucleus.   IMPRESSION: Decreased radiotracer activity in the LEFT and RIGHT striatum in aa pattern suggestive of Parkinson's syndrome pathology. Greater deficit on the LEFT.   Of note, DaTSCAN is not diagnostic of Parkinsonian syndromes, which remains a clinical diagnosis. DaTscan is an adjuvant test to aid in the clinical diagnosis of Parkinsonian syndromes.>>  SUBJECTIVE:   SUBJECTIVE STATEMENT: "I think I am falling asleep easier with this medication."   PAIN:  Are you having pain? No   OBJECTIVE:  GOALS: Goals reviewed with patient? Yes   SHORT TERM GOALS: Target date: 12/17/2022   Pt will implement word-finding strategies with 90% accuracy when unable to verbalize desired word in conversation/functional tasks with mi/mod assist.    Baseline: mod assist Goal status: partially met/continue   2.  Pt will describe objects and pictures by providing at least three salient features as judged by clinician with 90% acc when provided mi/mod cues. Baseline: 75% Goal status: partially met/continue   3.  Pt will utilize external memory strategies in home and community environment to increase safety and recall with mi/mod cues and caregiver education. Baseline: needs to be set up Goal status: partially met/continue   4.  Pt will verbally express thoughts by verbalizing sentences with mi/mod prompts from SLP/caregiver to help Pt stay on topic (word prompts, written prompts) with 90% effectiveness. Baseline: mod/max assist to guide the conversation Goal status: partially met/continue     LONG TERM GOALS: Same as short term goals   ASSESSMENT:   CLINICAL IMPRESSION: (from initial evaluation) Patient is a 72  y.o. female who was seen today for a cognitive linguistic evaluation due to mild cognitive impairment per neurology. Pt scored a 12/30 on the [facility] (see above) with relative strength in answering questions regarding a short store (8/8). She was only able to name 3 animals in 60 seconds, unable to complete mental calculations (but did complete addition, subtraction, and multiplication accurately with written cues), and unable to complete delayed recall task (2/5 for immediate recall and 0/5 for delayed recall). During the clock drawing task, she was able to write 12 (and few extra with repetition) numbers around the clock, but no hands. She achieved 13/15 on the [place] short form. She completed a picture description task ([place] with relative strength in content words (husband reports these are often lacking at home). During conversation, Pt appeared to frequently lose her train of thought and responded well to SLP cues for guided conversation (will reinforce with written cues next session). Recommend short term SLP therapy to provide Pt and family education on how to improve communication at home and increase safety for the future given her memory deficits. Pt and spouse are in agreement with plan  of care.    OBJECTIVE IMPAIRMENTS: include attention, memory, executive functioning, expressive language, and receptive language. These impairments are limiting patient from return to work, managing medications, managing appointments, managing finances, and effectively communicating at home and in community. Factors affecting potential to achieve goals and functional outcome are ability to learn/carryover information, medical prognosis, and previous level of function. Patient will benefit from skilled SLP services to address above impairments and improve overall function.   REHAB POTENTIAL: Good   PLAN:   SLP FREQUENCY: 1x/week   SLP DURATION: 4 weeks   PLANNED  INTERVENTIONS: Language facilitation, Environmental controls, Cueing hierachy, Cognitive reorganization, Internal/external aids, Functional tasks, Multimodal communication approach, SLP instruction and feedback, Compensatory strategies, and Patient/family education      TODAY'S TREATMENT: Pt accompanied to SLP therapy by her husband, Natalie Morrow. He reports that his evening diversion tactics (going on a drive, changing activity, and taking a walk) have been more successful this past week and evenings have been calmer. Their son, Natalie Morrow, came to visit last week and "got to see first hand" how the evenings can be challenging. They purchased a notebook and some picture cards as requested. SLP organized the binder today and added Natalie information including list of medications and "memories" section. Natalie Morrow reports that she thinks she is able to fall asleep more quickly with the recent change in medications. She was able to verbalize "advice to give young people" for SLP to write in her memory folder. She stated, "Don't try to grow up too quickly" and then verbally expand in a short discussion with SLP. Next session, continue to work on word finding activities and provide HEP for the same in preparation for discharge from SLP services.      DATE: 11/25/22    Thank you,   Genene Churn, [place]  [place], [place] 11/25/2022, 11:34 AM

## 2022-12-02 ENCOUNTER — Encounter (HOSPITAL_COMMUNITY): Payer: Self-pay | Admitting: Speech Pathology

## 2022-12-02 ENCOUNTER — Ambulatory Visit (HOSPITAL_COMMUNITY): Payer: PPO | Admitting: Speech Pathology

## 2022-12-02 DIAGNOSIS — R41841 Cognitive communication deficit: Secondary | ICD-10-CM

## 2022-12-02 NOTE — Therapy (Signed)
OUTPATIENT SPEECH LANGUAGE PATHOLOGY TREATMENT NOTE   Patient Name: REONA HODGE MRN: [id number] DOB:1950/09/27, 72 y.o., female Today's Date: 12/02/2022  PCP: Manon Hilding, MD REFERRING PROVIDER: Rondel Jumbo, PA-C  END OF SESSION:   End of Session - 12/02/22 1058     Visit Number 9    Number of Visits 10    Date for SLP Re-Evaluation 12/17/22    Authorization Type [employer]   eff date 09/14/22 oop max 3200 met -0 ded-0 visit limit-no auth-no copay$15.00 co ins-0   SLP Start Time 1030    SLP Stop Time  1115    SLP Time Calculation (min) 45 min    Activity Tolerance Patient tolerated treatment well             Past Medical History:  Diagnosis Date   AK (actinic keratosis) 09/17/2015   Arthritis    Bulging lumbar disc    Bursitis of hip    Fibromyalgia    GERD (gastroesophageal reflux disease)    Hormone replacement therapy (HRT) 09/05/2013   Hypercholesteremia    Hypothyroidism    Plantar fasciitis, bilateral    Rosacea    Past Surgical History:  Procedure Laterality Date   BACK SURGERY  05/2015   CHOLECYSTECTOMY     COLONOSCOPY N/A 11/10/2012   Procedure: COLONOSCOPY;  Surgeon: Rogene Houston, MD;  [facility];  Service: Endoscopy;  Laterality: N/A;  730   COLONOSCOPY WITH PROPOFOL N/A 04/08/2018   Procedure: COLONOSCOPY WITH PROPOFOL;  Surgeon: Rogene Houston, MD;  [facility];  Service: Endoscopy;  Laterality: N/A;  100   TONSILLECTOMY     Patient Active Problem List   Diagnosis Date Noted   Memory impairment 08/29/2022   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[place]) 06/18/2022   Cognitive dysfunction 11/04/2020   Screening for colorectal cancer 12/07/2019   Encounter for well woman exam with routine gynecological exam 12/07/2019   Family hx of colon cancer 02/17/2018   Rectocele 10/12/2017   Well female exam with routine gynecological exam 10/12/2017   Vaginal dryness 10/12/2017   AK  (actinic keratosis) 09/17/2015    ONSET DATE: 08/28/2022    REFERRING DIAG: G30.9,F06.70 (ICD-10-CM) - 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[place])   THERAPY DIAG:  Cognitive communication deficit   Rationale for Evaluation and Treatment: Rehabilitation  PERTINENT HISTORY: JOELIZ CASTELLAN is a very pleasant 72 y.o. left handed female who was referred for SLP evaluation and treatment by Sharene Butters, NP (neurology). Neuropsychological testing on 10/28/2020 showed mild cognitive deficits in memory and executive function but did not meet criteria for dementia.  PET scan of brain on 03/10/2021 showed decreased relative cortical metabolism within the left parietal lobe as may be seen in Alzheimer's type pathology. Patient was recently taken off rivastigmine 4.5 mg bid, due to possible increasing tremors and dizziness. Pt and spouse currently report that tremor and dizziness have improved, but that word finding appears worse. Most recent EEG was negative for seizure activity. Neurology noted some parkinsonian features during exam, such as hypomimia, R postural and intention tremor, shorter stride, gait is normal. She also appears to have some involuntary R arm spasmodic movements, mild. Latest MRI brain is remarkable for minimal chronic small vessel disease ( similar to prior MRI 02/02/21), stable mild generalized atrophy. She will be going for a DaT scan next week. Pt reports feeling "sad" about her changes in memory and would like to "get back  to normal". She no longer drives or cooks, but can stay at home for a few hours alone while her husband, [name], runs errands. She does not leave the house very often, but does have some friends who come by to visit. She has a son, [name], who lives in [place] and grandson [name], [place]). She last was employed at the [employer]. She enjoys watching westerns with her husband and the [employer] channel.  Other: DaT scan:  <<FINDINGS: Decreased  radiotracer activity within the LEFT striatum with near absent loss in the LEFT putamen and decreased activity in the head of the LEFT caudate nucleus.   Decreased radiotracer activity in the RIGHT putamen and relative the maintained activity in the head of the RIGHT caudate nucleus.   IMPRESSION: Decreased radiotracer activity in the LEFT and RIGHT striatum in aa pattern suggestive of Parkinson's syndrome pathology. Greater deficit on the LEFT.   Of note, DaTSCAN is not diagnostic of Parkinsonian syndromes, which remains a clinical diagnosis. DaTscan is an adjuvant test to aid in the clinical diagnosis of Parkinsonian syndromes.>>  SUBJECTIVE:   SUBJECTIVE STATEMENT: "I think I am falling asleep easier with this medication."   PAIN:  Are you having pain? No   OBJECTIVE:  GOALS: Goals reviewed with patient? Yes   SHORT TERM GOALS: Target date: 12/17/2022   Pt will implement word-finding strategies with 90% accuracy when unable to verbalize desired word in conversation/functional tasks with mi/mod assist.    Baseline: mod assist Goal status: partially met/continue   2.  Pt will describe objects and pictures by providing at least three salient features as judged by clinician with 90% acc when provided mi/mod cues. Baseline: 75% Goal status: partially met/continue   3.  Pt will utilize external memory strategies in home and community environment to increase safety and recall with mi/mod cues and caregiver education. Baseline: needs to be set up Goal status: partially met/continue   4.  Pt will verbally express thoughts by verbalizing sentences with mi/mod prompts from SLP/caregiver to help Pt stay on topic (word prompts, written prompts) with 90% effectiveness. Baseline: mod/max assist to guide the conversation Goal status: partially met/continue     LONG TERM GOALS: Same as short term goals   ASSESSMENT:   CLINICAL IMPRESSION: (from initial evaluation) Patient is a 72  y.o. female who was seen today for a cognitive linguistic evaluation due to mild cognitive impairment per neurology. Pt scored a 12/30 on the [facility] (see above) with relative strength in answering questions regarding a short store (8/8). She was only able to name 3 animals in 60 seconds, unable to complete mental calculations (but did complete addition, subtraction, and multiplication accurately with written cues), and unable to complete delayed recall task (2/5 for immediate recall and 0/5 for delayed recall). During the clock drawing task, she was able to write 12 (and few extra with repetition) numbers around the clock, but no hands. She achieved 13/15 on the [place] short form. She completed a picture description task ([place]) with relative strength in content words (husband reports these are often lacking at home). During conversation, Pt appeared to frequently lose her train of thought and responded well to SLP cues for guided conversation (will reinforce with written cues next session). Recommend short term SLP therapy to provide Pt and family education on how to improve communication at home and increase safety for the future given her memory deficits. Pt and spouse are in agreement with plan  of care.    OBJECTIVE IMPAIRMENTS: include attention, memory, executive functioning, expressive language, and receptive language. These impairments are limiting patient from return to work, managing medications, managing appointments, managing finances, and effectively communicating at home and in community. Factors affecting potential to achieve goals and functional outcome are ability to learn/carryover information, medical prognosis, and previous level of function. Patient will benefit from skilled SLP services to address above impairments and improve overall function.   REHAB POTENTIAL: Good   PLAN:   SLP FREQUENCY: 1x/week   SLP DURATION: 4 weeks   PLANNED  INTERVENTIONS: Language facilitation, Environmental controls, Cueing hierachy, Cognitive reorganization, Internal/external aids, Functional tasks, Multimodal communication approach, SLP instruction and feedback, Compensatory strategies, and Patient/family education      TODAY'S TREATMENT: Pt accompanied to SLP therapy by her husband, JW. Pt completed moderately complex confrontation naming activity with 100% acc. She was also encouraged to utilize word finding strategies to describe words by function and association when describing photo scenes. Her husband reports continued night time confusion and he is using diversion techniques when possible. We added new information to her notebook to facilitate orientation and they were encouraged to add some family photos for her to review in the evenings. She volunteered that he mother had dementia and required full care at a long term care facility. SLP inquired if she and JW had discussions regarding their wishes. They said that they have not formally done that. [place] stated that she would not want to live in a nursing home. She may benefit from a palliative care consult as an outpatient to help her establish her goals of care. Next session, continue to work on word finding activities and provide HEP for the same in preparation for discharge from SLP services.      DATE: 12/02/22    Thank you,   Genene Churn, [place]  [place], [place] 12/02/2022, 11:03 AM

## 2022-12-07 ENCOUNTER — Ambulatory Visit: Payer: PPO | Admitting: Physician Assistant

## 2022-12-07 VITALS — BP 115/78 | HR 88 | Wt 117.0 lb

## 2022-12-07 DIAGNOSIS — G20A1 Parkinson's disease without dyskinesia, without mention of fluctuations: Secondary | ICD-10-CM | POA: Diagnosis not present

## 2022-12-07 DIAGNOSIS — G309 Alzheimer's disease, unspecified: Secondary | ICD-10-CM | POA: Diagnosis not present

## 2022-12-07 DIAGNOSIS — F0670 Mild neurocognitive disorder due to known physiological condition without behavioral disturbance: Secondary | ICD-10-CM | POA: Diagnosis not present

## 2022-12-07 MED ORDER — MEMANTINE HCL 10 MG PO TABS: ORAL_TABLET | ORAL | 11 refills | Status: DC

## 2022-12-07 MED ORDER — CARBIDOPA-LEVODOPA 25-100 MG PO TABS: ORAL_TABLET | ORAL | 3 refills | Status: DC

## 2022-12-07 NOTE — Patient Instructions (Signed)
Continue Carbidopa levodopa Increase memantine to 10 mg 2 times a day Follow up in 6 months

## 2022-12-07 NOTE — Progress Notes (Signed)
Assessment/Plan:   Memory Impairment   Natalie Morrow is a very pleasant 72 y.o. [place] female presenting today with a history of Mild Coin follow-up for evaluation of memory loss.In review,  a neuropsychological testing on 10/28/2020 showed mild cognitive deficits in memory and executive function, but did not meet the criteria for dementia.  PET scan of the brain on 03/10/2021 showed decrease relative cortical metabolism within the left parietal lobe as may be seen in Alzheimer's type pathology.  She was on rivastigmine but because of the tremors, she wanted to discontinue the med and see if this would improve.  Patient is on memantine 5 mg bid, tolerating well.  MMSE today is 22/30, stable. She is to complete ST, reporting that this has been helpful.  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 In today's visit, she has B, R>L intention tremor without facial tremor.  She is on Sinemet 25-100 mg tid tolerating well, tremors are well controlled  Recommendations:   Follow up in 6  months. Increase memantine to 10 mg bid, side effects discussed Continue Speech therapy Discuss potential meds for sundowning, but husband and patient decided to wait unless symptoms were to be severe.  Continue mobility for strength and balance.  Recommend good control of cardiovascular risk factors.   Continue to control mood as per PCP    Subjective:   This patient is accompanied in the office by her husband  who supplements the history. Previous records as well as any outside records available were reviewed prior to todays visit.   Patient was last seen on 09/30/22    Any changes in memory since last visit? "Some good and bad days". "STM is not good", according to patient   She has trouble putting sentences together, trying to combine what she wants to say. She is able to remember conversations and family member's names.  She is able to perform simple ADLs without any issues.  repeats  oneself?  Endorsed,  when sundowning happens, she may repeat statements more often, such as "promise me" Disoriented when walking into a room?   Some days .  Leaving objects in unusual places?  Patient denies   Wandering behavior?   denies   Any personality changes since last visit?   denies   Any worsening depression?: denies   Hallucinations or paranoia? Sometimes she may feel someone's presence" Ain't no one here right?" Seizures?   denies    Any sleep changes? Sleeps a lot ,sometimes walks around in the night.Some nightmare, REM behavior or sleepwalking   Sleep apnea?   denies   Any hygiene concerns?   denies   Independent of bathing and dressing?  Endorsed  Does the patient needs help with medications? Husband is in charge   Who is in charge of the finances? Husband  is in charge     Any changes in appetite?  " Losing some weight " Patient have trouble swallowing?  denies   Does the patient cook?  Any kitchen accidents such as leaving the stove on?   denies   Any headaches?    denies   Vision changes? Denies, no double vision Chronic back pain  denies   Ambulates with difficulty?  She reports some balance difficulty, but does not want to use the cane.   denies dropping objects or trouble buttoning or writing but the last 3 days she has been going to the mailbox herself  Recent falls or head injuries?  denies     Unilateral weakness, numbness or tingling?   denies   Any tremors?  Endorsed.  She has a history of mild chronic hand tremors, worse at night, right greater than left, with some involuntary spasmodic movements.  She is left-handed, so she denies any issues with writing.    Any anosmia?    denies   Any incontinence of urine?  denies   Any bowel dysfunction?  denies      No drooling No recent skin checkup Patient lives with husband   Initial evaluation 07/28/21  On the evening of 08/04/2020, she did not recognize her husband.  She wasn't agitated but didn't want to  sleep in the same bed and asked him to leave the house.  The next morning, she still didn't recognize her husband.  Her son came over and she did recognize her son.  They brought her to the ED.  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  CMP showed normal kidney and liver function with no electrolyte abnormalities.  TSH was 1.687.  WBC on CBC was normal.  UA was positive for UTI.  She was discharged on antibiotics.  B12, folate and RPR reportedly normal.  Over the next several weeks, symptoms slowly improved but have not completely resolved.  She will still sometimes briefly not recognize her husband.  On one occasion, she did not really recognize her son either.  She has still been able to perform all ADLs however she has not been driving since this started.  No headache, fever, cough.  Prior to onset, she had been experiencing dizziness/vertigo.  Also, she appeared to not be as talkative on the phone.  She has been under increased stress caring for her husband who has been having worsening kidney disease and may need to start dialysis.  EEG on 09/25/2020 showed mild left temporal slowing.  She underwent neuropsychological testing on 10/28/2020 showed mild cognitive deficits in memory and executive function but did not meet criteria for dementia.  PET scan of brain on 03/10/2021 showed decreased relative cortical metabolism within the left parietal lobe as may be seen in Alzheimer's type pathology. Mother and maternal aunt dementia. Her sister has been experiencing symptoms as well.   DaTscan 09/29/2022, shows decreased radiotracer activity in the left and right striata in a pattern suggestive of Parkinson syndrome pathology, greater deficit on the left.    MRI brain 07/15/22 No evidence of acute intracranial abnormality.2. Minimal chronic small-vessel ischemic changes within the cerebral white matter,  similar to the prior brain MRI of 02/02/2021.3. Mild generalized cerebral atrophy, stable.4. Mild paranasal sinus disease, as described.   PREVIOUS MEDICATIONS: donepezil "very vivid dreams", discontinued on 07/2021.  Rivastigmine (the patient believes that this causes tremors).  Past Medical History:  Diagnosis Date   AK (actinic keratosis) 09/17/2015   Arthritis    Bulging lumbar disc    Bursitis of hip    Fibromyalgia    GERD (gastroesophageal reflux disease)    Hormone replacement therapy (HRT) 09/05/2013   Hypercholesteremia    Hypothyroidism    Plantar fasciitis, bilateral    Rosacea      Past Surgical History:  Procedure Laterality Date   BACK SURGERY  05/2015   CHOLECYSTECTOMY     COLONOSCOPY N/A 11/10/2012   Procedure: COLONOSCOPY;  Surgeon: Rogene Houston, MD;  [facility];  Service: Endoscopy;  Laterality: N/A;  730   COLONOSCOPY WITH PROPOFOL N/A 04/08/2018   Procedure: COLONOSCOPY WITH PROPOFOL;  Surgeon: Rogene Houston, MD;  Location: [facility];  Service: Endoscopy;  Laterality: N/A;  100   TONSILLECTOMY       PREVIOUS MEDICATIONS:   CURRENT MEDICATIONS:  Outpatient Encounter Medications as of 12/07/2022  Medication Sig   acetaminophen (TYLENOL) 500 MG tablet Take 500 mg by mouth as needed for mild pain.    cholecalciferol (VITAMIN D3) 25 MCG (1000 UNIT) tablet Take 1,000 Units by mouth daily.   Famotidine (PEPCID AC PO) Take by mouth daily.   levothyroxine (SYNTHROID, LEVOTHROID) 50 MCG tablet Take 50 mcg by mouth daily.   loratadine (CLARITIN) 10 MG tablet Take 10 mg by mouth daily as needed for allergies.   [DISCONTINUED] carbidopa-levodopa (SINEMET) 25-100 MG tablet Start Carbidopa Levodopa as follows:  Take 1/2 tablet three times daily, at least 30 minutes before meals, for one week  Then take 1/2 tablet in the morning, 1/2 tablet in the afternoon, 1 tablet in the evening, at least 30 minutes before meals, for one week  Then take 1/2 tablet  in the morning, 1 tablet in the afternoon, 1 tablet in the evening, at least 30 minutes before meals, for one week  Then take 1 tablet three times daily, at least 30 minutes before meals   [DISCONTINUED] memantine (NAMENDA) 5 MG tablet Take 1 tablet (5 mg at night) for 2 weeks, then increase to 1 tablet (5 mg) twice a day   carbidopa-levodopa (SINEMET) 25-100 MG tablet take 1 tablet three times daily, at least 30 minutes before meals   escitalopram (LEXAPRO) 5 MG tablet Take 1 tablet (5 mg total) by mouth daily. (Patient not taking: Reported on 12/07/2022)   memantine (NAMENDA) 10 MG tablet Take 1 tablet twice a day   Probiotic Product (ALIGN) 4 MG CAPS Take 4 mg by mouth 2 (two) times a week. (Patient not taking: Reported on 12/07/2022)   [DISCONTINUED] carbidopa-levodopa (SINEMET) 25-100 MG tablet take 1 tablet three times daily, at least 30 minutes before meals   [DISCONTINUED] memantine (NAMENDA) 10 MG tablet Take 1 tablet twice a day   No facility-administered encounter medications on file as of 12/07/2022.     Objective:     PHYSICAL EXAMINATION:    VITALS:   Vitals:   12/07/22 1302  BP: 115/78  Pulse: 88  SpO2: 96%  Weight: 117 lb (53.1 kg)    GEN:  The patient appears stated age and is in NAD. HEENT:  Normocephalic, atraumatic.   Neurological examination:  General: NAD, well-groomed, appears stated age. Orientation: The patient is alert. Oriented to person, place and not to date Cranial nerves: There is good facial symmetry. Mild hypomimia. The speech is not fluent  but clear. No aphasia or dysarthria. Fund of knowledge is appropriate. Recent memory impaired and remote memory is normal.  Attention and concentration are normal.  Able to name objects and repeat phrases.  Hearing is intact to conversational tone .   Delayed recall 3/3 Sensation: Sensation is intact to light touch throughout Motor: Strength is at least antigravity x4. DTR's 2/4 in UE/LE       No data to  display             12/07/2022    8:00 PM 06/24/2022   11:46 AM 09/19/2021    8:00 AM  MMSE - Mini Mental State Exam  Orientation to time 2 2 3   Orientation to Place 5  3 5  Registration 3 3 3   Attention/ Calculation 2 5 2   Recall 3 2 2   Language- name 2 objects 2 2 2   Language- repeat 1 1 1   Language- follow 3 step command 3 2 3   Language- read & follow direction 1 1 1   Write a sentence 0 0 0  Copy design 0 0 0  Total score 22 21 22        Movement examination: Tone: There is normal tone in the UE/LE Abnormal movements:mild RUE>LUE tremors on intention, no facial tremor.No myoclonus.  No asterixis.   Coordination:  There is no decremation with RAM's. Normal finger to nose  Gait and Station: The patient has no difficulty arising out of a deep-seated chair without the use of the hands. The patient's stride length is good, leans to the L, stride is short, gait is cautious and narrow.   Thank you for allowing [place] the opportunity to participate in the care of this nice patient. Please do not hesitate to contact us for any questions or concerns.   Total time spent on today's visit was 45 minutes dedicated to this patient today, preparing to see patient, examining the patient, ordering tests and/or medications and counseling the patient, documenting clinical information in the EHR or other health record, independently interpreting results and communicating results to the patient/family, discussing treatment and goals, answering patient's questions and coordinating care.  Cc:  Manon Hilding, MD  Sharene Butters 12/07/2022 8:13 PM

## 2022-12-09 ENCOUNTER — Encounter (HOSPITAL_COMMUNITY): Payer: Self-pay | Admitting: Speech Pathology

## 2022-12-09 ENCOUNTER — Ambulatory Visit (HOSPITAL_COMMUNITY): Payer: PPO | Admitting: Speech Pathology

## 2022-12-09 DIAGNOSIS — R41841 Cognitive communication deficit: Secondary | ICD-10-CM | POA: Diagnosis not present

## 2022-12-09 NOTE — Therapy (Signed)
OUTPATIENT SPEECH LANGUAGE PATHOLOGY TREATMENT NOTE   Patient Name: Natalie Morrow MRN: [id number] DOB:1951-07-29, 72 y.o., female Today's Date: 12/09/2022  PCP: Natalie Hilding, MD REFERRING PROVIDER: Rondel Jumbo, PA-C  END OF SESSION:   End of Session - 12/09/22 1119     Visit Number 10    Number of Visits 10    Date for SLP Re-Evaluation 12/17/22    Authorization Type [employer]   eff date 09/14/22 oop max 3200 met -0 ded-0 visit limit-no auth-no copay$15.00 co ins-0   SLP Start Time 1020    SLP Stop Time  1110    SLP Time Calculation (min) 50 min    Activity Tolerance Patient tolerated treatment well             Past Medical History:  Diagnosis Date   AK (actinic keratosis) 09/17/2015   Arthritis    Bulging lumbar disc    Bursitis of hip    Fibromyalgia    GERD (gastroesophageal reflux disease)    Hormone replacement therapy (HRT) 09/05/2013   Hypercholesteremia    Hypothyroidism    Plantar fasciitis, bilateral    Rosacea    Past Surgical History:  Procedure Laterality Date   BACK SURGERY  05/2015   CHOLECYSTECTOMY     COLONOSCOPY N/A 11/10/2012   Procedure: COLONOSCOPY;  Surgeon: Rogene Houston, MD;  [facility];  Service: Endoscopy;  Laterality: N/A;  730   COLONOSCOPY WITH PROPOFOL N/A 04/08/2018   Procedure: COLONOSCOPY WITH PROPOFOL;  Surgeon: Rogene Houston, MD;  [facility];  Service: Endoscopy;  Laterality: N/A;  100   TONSILLECTOMY     Patient Active Problem List   Diagnosis Date Noted   Memory impairment 08/29/2022   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[place]) 06/18/2022   Cognitive dysfunction 11/04/2020   Screening for colorectal cancer 12/07/2019   Encounter for well woman exam with routine gynecological exam 12/07/2019   Family hx of colon cancer 02/17/2018   Rectocele 10/12/2017   Well female exam with routine gynecological exam 10/12/2017   Vaginal dryness 10/12/2017   AK  (actinic keratosis) 09/17/2015    ONSET DATE: 08/28/2022    REFERRING DIAG: G30.9,F06.70 (ICD-10-CM) - 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[place])   THERAPY DIAG:  Cognitive communication deficit   Rationale for Evaluation and Treatment: Rehabilitation  PERTINENT HISTORY: Natalie Morrow is a very pleasant 72 y.o. left handed female who was referred for SLP evaluation and treatment by Natalie Butters, NP (neurology). Neuropsychological testing on 10/28/2020 showed mild cognitive deficits in memory and executive function but did not meet criteria for dementia.  PET scan of brain on 03/10/2021 showed decreased relative cortical metabolism within the left parietal lobe as may be seen in Alzheimer's type pathology. Patient was recently taken off rivastigmine 4.5 mg bid, due to possible increasing tremors and dizziness. Pt and spouse currently report that tremor and dizziness have improved, but that word finding appears worse. Most recent EEG was negative for seizure activity. Neurology noted some parkinsonian features during exam, such as hypomimia, R postural and intention tremor, shorter stride, gait is normal. She also appears to have some involuntary R arm spasmodic movements, mild. Latest MRI brain is remarkable for minimal chronic small vessel disease ( similar to prior MRI 02/02/21), stable mild generalized atrophy. She will be going for a DaT scan next week. Pt reports feeling "sad" about her changes in memory and would like to "get back  to normal". She no longer drives or cooks, but can stay at home for a few hours alone while her husband, [name], runs errands. She does not leave the house very often, but does have some friends who come by to visit. She has a son, Natalie Morrow, who lives in [place] and grandson Natalie Morrow, [place]). She last was employed at the [employer]. She enjoys watching westerns with her husband and the [employer] channel.  Other: DaT scan:  <<FINDINGS: Decreased  radiotracer activity within the LEFT striatum with near absent loss in the LEFT putamen and decreased activity in the head of the LEFT caudate nucleus.   Decreased radiotracer activity in the RIGHT putamen and relative the maintained activity in the head of the RIGHT caudate nucleus.   IMPRESSION: Decreased radiotracer activity in the LEFT and RIGHT striatum in aa pattern suggestive of Parkinson's syndrome pathology. Greater deficit on the LEFT.   Of note, DaTSCAN is not diagnostic of Parkinsonian syndromes, which remains a clinical diagnosis. DaTscan is an adjuvant test to aid in the clinical diagnosis of Parkinsonian syndromes.>>  SUBJECTIVE:   SUBJECTIVE STATEMENT: "I am not really feeling great today."   PAIN:  Are you having pain? No   OBJECTIVE:  GOALS: Goals reviewed with patient? Yes   SHORT TERM GOALS: Target date: 12/17/2022   Pt will implement word-finding strategies with 90% accuracy when unable to verbalize desired word in conversation/functional tasks with mi/mod assist.    Baseline: mod assist Goal status: MET   2.  Pt will describe objects and pictures by providing at least three salient features as judged by clinician with 90% acc when provided mi/mod cues. Baseline: 75% Goal status: MET   3.  Pt will utilize external memory strategies in home and community environment to increase safety and recall with mi/mod cues and caregiver education. Baseline: needs to be set up Goal status: MET   4.  Pt will verbally express thoughts by verbalizing sentences with mi/mod prompts from SLP/caregiver to help Pt stay on topic (word prompts, written prompts) with 90% effectiveness. Baseline: mod/max assist to guide the conversation Goal status: partially met/continue     LONG TERM GOALS: Same as short term goals   ASSESSMENT:   CLINICAL IMPRESSION: (from initial evaluation) Patient is a 72 y.o. female who was seen today for a cognitive linguistic evaluation due to  mild cognitive impairment per neurology. Pt scored a 12/30 on the [facility] (see above) with relative strength in answering questions regarding a short store (8/8). She was only able to name 3 animals in 60 seconds, unable to complete mental calculations (but did complete addition, subtraction, and multiplication accurately with written cues), and unable to complete delayed recall task (2/5 for immediate recall and 0/5 for delayed recall). During the clock drawing task, she was able to write 12 (and few extra with repetition) numbers around the clock, but no hands. She achieved 13/15 on the [place] short form. She completed a picture description task ([place] with relative strength in content words (husband reports these are often lacking at home). During conversation, Pt appeared to frequently lose her train of thought and responded well to SLP cues for guided conversation (will reinforce with written cues next session). Recommend short term SLP therapy to provide Pt and family education on how to improve communication at home and increase safety for the future given her memory deficits. Pt and spouse are in agreement with plan of care.    OBJECTIVE  IMPAIRMENTS: include attention, memory, executive functioning, expressive language, and receptive language. These impairments are limiting patient from return to work, managing medications, managing appointments, managing finances, and effectively communicating at home and in community. Factors affecting potential to achieve goals and functional outcome are ability to learn/carryover information, medical prognosis, and previous level of function. Patient will benefit from skilled SLP services to address above impairments and improve overall function.   REHAB POTENTIAL: Good   PLAN:   SLP FREQUENCY: 1x/week   SLP DURATION: 4 weeks   PLANNED INTERVENTIONS: Language facilitation, Environmental controls, Cueing hierachy, Cognitive  reorganization, Internal/external aids, Functional tasks, Multimodal communication approach, SLP instruction and feedback, Compensatory strategies, and Patient/family education      TODAY'S TREATMENT: Pt accompanied to SLP therapy by her husband, JW. She brought her notebook into the session, but shared with JW prior to coming into the room that she did not like carrying the notebook because it made her feel like something was wrong with her. SLP empathized with Pt and reinforced that the difficulties she is having has nothing to do with her intelligence, but that this is a disease that is happening to her brain through no fault of her own. SLP explained that the goal is to practice locating information in the notebook when she is lucid, so that she can use the notebook as needed when she becomes confused. They did not add any family photos to the sheet protector page, so they were encouraged to do that when family comes this weekend. JW continues to use the redirection techniques discussed in session, at home in the evening as needed. [place] reported "not feeling great" today, but was agreeable to participate in session. She was asked to read an open ended question from a card, answer it, and then have SLP and JW also respond. She became visibly frustrated when she lost her train of thought and had difficulty retrieving words. SLP provided written cues, but writing down key words in the discussion to help [place] track her thoughts. They were encouraged to continue to review and add information to her notebook at home, play card games, complete chores, and maintain a routine at home going forward. Pt met and/or partially met the above goals and will be discharged from SLP services at this time. Pt may need to return to therapy in the future if language and memory skills deteriorate significantly in order to modify routines at home.      DATE: 12/09/22   SPEECH THERAPY DISCHARGE SUMMARY  Visits from  Start of Care: 10   Current functional level related to goals / functional outcomes: Met, see above   Remaining deficits: Pt presents with memory deficits and expressive language deficits as stated above   Education / Equipment: HEP provided   Patient agrees to discharge. Patient goals were met. Patient is being discharged due to meeting the stated rehab goals..    Thank you,   Genene Churn, [place]  [place], [place] 12/09/2022, 11:20 AM

## 2022-12-17 ENCOUNTER — Ambulatory Visit: Payer: PPO | Admitting: Physician Assistant

## 2022-12-30 DIAGNOSIS — Z1231 Encounter for screening mammogram for malignant neoplasm of breast: Secondary | ICD-10-CM | POA: Diagnosis not present

## 2023-02-15 ENCOUNTER — Encounter (INDEPENDENT_AMBULATORY_CARE_PROVIDER_SITE_OTHER): Payer: Self-pay | Admitting: *Deleted

## 2023-04-04 DIAGNOSIS — F0390 Unspecified dementia without behavioral disturbance: Secondary | ICD-10-CM | POA: Diagnosis not present

## 2023-04-04 DIAGNOSIS — W19XXXA Unspecified fall, initial encounter: Secondary | ICD-10-CM | POA: Diagnosis not present

## 2023-04-04 DIAGNOSIS — R42 Dizziness and giddiness: Secondary | ICD-10-CM | POA: Diagnosis not present

## 2023-04-04 DIAGNOSIS — Z043 Encounter for examination and observation following other accident: Secondary | ICD-10-CM | POA: Diagnosis not present

## 2023-04-04 DIAGNOSIS — R0781 Pleurodynia: Secondary | ICD-10-CM | POA: Diagnosis not present

## 2023-04-04 DIAGNOSIS — R55 Syncope and collapse: Secondary | ICD-10-CM | POA: Diagnosis not present

## 2023-04-04 DIAGNOSIS — E079 Disorder of thyroid, unspecified: Secondary | ICD-10-CM | POA: Diagnosis not present

## 2023-04-04 DIAGNOSIS — M79604 Pain in right leg: Secondary | ICD-10-CM | POA: Diagnosis not present

## 2023-04-06 ENCOUNTER — Encounter: Payer: Self-pay | Admitting: Psychology

## 2023-04-06 DIAGNOSIS — E7800 Pure hypercholesterolemia, unspecified: Secondary | ICD-10-CM | POA: Insufficient documentation

## 2023-04-06 DIAGNOSIS — M7070 Other bursitis of hip, unspecified hip: Secondary | ICD-10-CM | POA: Insufficient documentation

## 2023-04-06 DIAGNOSIS — M1990 Unspecified osteoarthritis, unspecified site: Secondary | ICD-10-CM | POA: Insufficient documentation

## 2023-04-06 DIAGNOSIS — E039 Hypothyroidism, unspecified: Secondary | ICD-10-CM | POA: Insufficient documentation

## 2023-04-06 DIAGNOSIS — M797 Fibromyalgia: Secondary | ICD-10-CM | POA: Insufficient documentation

## 2023-04-06 DIAGNOSIS — M5136 Other intervertebral disc degeneration, lumbar region: Secondary | ICD-10-CM | POA: Insufficient documentation

## 2023-04-06 DIAGNOSIS — K219 Gastro-esophageal reflux disease without esophagitis: Secondary | ICD-10-CM | POA: Insufficient documentation

## 2023-04-07 DIAGNOSIS — F0390 Unspecified dementia without behavioral disturbance: Secondary | ICD-10-CM | POA: Diagnosis not present

## 2023-04-07 DIAGNOSIS — R55 Syncope and collapse: Secondary | ICD-10-CM | POA: Diagnosis not present

## 2023-04-07 DIAGNOSIS — R030 Elevated blood-pressure reading, without diagnosis of hypertension: Secondary | ICD-10-CM | POA: Diagnosis not present

## 2023-04-07 DIAGNOSIS — Z681 Body mass index (BMI) 19 or less, adult: Secondary | ICD-10-CM | POA: Diagnosis not present

## 2023-04-07 DIAGNOSIS — G20A1 Parkinson's disease without dyskinesia, without mention of fluctuations: Secondary | ICD-10-CM | POA: Diagnosis not present

## 2023-04-07 DIAGNOSIS — R32 Unspecified urinary incontinence: Secondary | ICD-10-CM | POA: Diagnosis not present

## 2023-04-07 DIAGNOSIS — I951 Orthostatic hypotension: Secondary | ICD-10-CM | POA: Diagnosis not present

## 2023-04-07 DIAGNOSIS — R42 Dizziness and giddiness: Secondary | ICD-10-CM | POA: Diagnosis not present

## 2023-04-08 ENCOUNTER — Ambulatory Visit: Payer: PPO | Admitting: Psychology

## 2023-04-08 ENCOUNTER — Other Ambulatory Visit: Payer: Self-pay | Admitting: Physician Assistant

## 2023-04-08 ENCOUNTER — Encounter: Payer: Self-pay | Admitting: Psychology

## 2023-04-08 DIAGNOSIS — R4189 Other symptoms and signs involving cognitive functions and awareness: Secondary | ICD-10-CM

## 2023-04-08 DIAGNOSIS — F0390 Unspecified dementia without behavioral disturbance: Secondary | ICD-10-CM | POA: Insufficient documentation

## 2023-04-08 DIAGNOSIS — F03B0 Unspecified dementia, moderate, without behavioral disturbance, psychotic disturbance, mood disturbance, and anxiety: Secondary | ICD-10-CM

## 2023-04-08 DIAGNOSIS — G20C Parkinsonism, unspecified: Secondary | ICD-10-CM | POA: Insufficient documentation

## 2023-04-08 HISTORY — DX: Unspecified dementia, unspecified severity, without behavioral disturbance, psychotic disturbance, mood disturbance, and anxiety: F03.90

## 2023-04-08 NOTE — Progress Notes (Signed)
   Psychometrician Note   Cognitive testing was administered to Natalie Morrow by Natalie Morrow, B.S. (psychometrist) under the supervision of Dr. [name] Nickels, Ph.D., licensed psychologist on 04/08/2023. Natalie Morrow did not appear overtly distressed by the testing session per behavioral observation or responses across self-report questionnaires. Rest breaks were offered.    The battery of tests administered was selected by Dr. [name] Nickels, Ph.D. with consideration to Natalie Morrow's current level of functioning, the nature of her symptoms, emotional and behavioral responses during interview, level of literacy, observed level of motivation/effort, and the nature of the referral question. This battery was communicated to the psychometrist. Communication between Dr. [name] Nickels, Ph.D. and the psychometrist was ongoing throughout the evaluation and Dr. [name] Nickels, Ph.D. was immediately accessible at all times. Dr. [name] Nickels, Ph.D. provided supervision to the psychometrist on the date of this service to the extent necessary to assure the quality of all services provided.    Natalie Morrow will return within approximately 1-2 weeks for an interactive feedback session with Natalie Morrow at which time her test performances, clinical impressions, and treatment recommendations will be reviewed in detail. Natalie Morrow understands she can contact our office should she require our assistance before this time.  A total of 95 minutes of billable time were spent face-to-face with Natalie Morrow by the psychometrist. This includes both test administration and scoring time. Billing for these services is reflected in the clinical report generated by Dr. [name] Nickels, Ph.D.  This note reflects time spent with the psychometrician and does not include test scores or any clinical interpretations made by Natalie Morrow. The full report will follow in a separate note.

## 2023-04-08 NOTE — Progress Notes (Signed)
NEUROPSYCHOLOGICAL EVALUATION [name] Department of Neurology  Date of Evaluation: April 08, 2023  Reason for Referral:   Natalie Morrow is a 72 y.o. left-handed Caucasian female referred by [name], PA-C, to characterize her current cognitive functioning and assist with diagnostic clarity and treatment planning in the context of a prior mild neurocognitive disorder diagnosis, abnormal PET and DaTscan imaging, and concerns for progressive cognitive and functional decline.   Assessment and Plan:   Clinical Impression(s): Natalie Morrow's pattern of performance is suggestive of diffuse and extremely severe cognitive impairment. She was able to adequately copy a complex figure. However, all other assessed domains exhibited very severe impairment. This includes processing speed, basic attention, cognitive flexibility, receptive and expressive language, other visuospatial tasks, and all aspects of learning and memory. Functionally, Natalie Morrow no longer drives and her husband has fully taken over all medication management, financial management, and bill paying responsibilities. Given evidence for cognitive and functional impairment, she meets diagnostic criteria for a Major Neurocognitive Disorder ("dementia") at the present time.  Relative to her previous February 2022 evaluation, cognitive decline was exhibited across all assessed cognitive domains. Decline was fairly significant in many cases, suggesting a fairly accelerated rate of change relative to her previous evaluation. No areas saw improvement or consistent stability. Functional decline also seems to have exhibited a fairly rapid decline as Natalie Morrow was not experiencing prominent ADL dysfunction at the time of her previous evaluation outside of driving concerns.   The etiology for her current dementia presentation is unclear. Given such dramatic changes and negative progression over the past two  years, I do have some concern that Natalie Morrow is currently dealing with a UTI or another metabolic abnormality which could be actively worsening her current clinical presentation. She was extremely disoriented, appeared easily confused, and was often unable to complete her sentences (i.e., she commonly tailed off towards the end of what she was trying to say). However, labs do suggest that a recent urinalysis performed on 04/04/2023 was unremarkable.   Outside of this, current testing does not yield any interpretable patterns of strength and weakness given severe, diffuse impairment. A prior DaTscan in 2023 did suggest abnormalities consistent with an illness on the parkinsonian spectrum. Given this, a Parkinson's disease dementia presentation remains a possibility. However, her rate of change would appear to be quite accelerated relative to what is typical assuming there is no underlying UTI or other abnormality creating dysfunction. Her husband did highlight some kicking behaviors while asleep and alluded to concerns surrounding visual hallucinations. She was a very limited historian but did acknowledge seeing a fully-formed cat in her environment from to time. He also described some spasmodic, jerking behaviors. These could raise the risk for an atypical parkinsonian presentation such as Lewy body disease or corticobasal degeneration. These illnesses are less common but typically do have a more accelerated rate of decline relative to idiopathic Parkinson's disease.   A prior PET scan in 2022 revealed decreased cortical metabolism in the left parietal lobe, leading some in her medical records to theorize underlying Alzheimer's disease. While temporal and parietal hypometabolism can be worrisome for this illness, typical metabolic changes are first observed in the temporal regions and later spread to parietal regions in Alzheimer's disease. There is also research to suggest that left parietal metabolic changes  in particular are among the regions with the largest metabolic change in healthy individuals (i.e., normal aging process). There remains the potential that this finding  represents somewhat of a red herring and that a parkinsonian presentation best fits current data. However, there also remains the potential for a mixed presentation as there can be co-occurring presentations of Alzheimer's disease and Parkinson's disease or another parkinsonian presentation (Alzheimer's disease and Lewy body disease can co-occur around 20-30% of the time for example). It is worth highlighting, given the results of her DaTscan, that Lewy body disease can also have parietal hypometabolism across a PET scan (an occipital/parietal pattern is common). Unfortunately, I am unable to provide further diagnostic clarity at the present time.   Recommendations: Natalie Morrow has already been prescribed a medication aimed to address memory loss and concerns surrounding a neurodegenerative illness (i.e., memantine/Namenda). She is encouraged to continue taking this medication as prescribed assuming she does not experience adverse side effects as she reportedly did with Aricept and Exelon. It is important to highlight that this medication has been shown to slow functional decline in some individuals. There is no current treatment which can stop or reverse cognitive decline when caused by a neurodegenerative illness.   I agree with the decision to have Natalie Morrow fully abstain from all driving pursuits given the extent of ongoing cognitive impairment.   It will be important for Natalie Morrow to have another person with her when in situations where she may need to process information, weigh the pros and cons of different options, and make decisions, in order to ensure that she fully understands and recalls all information to be considered.  If not already done, Natalie Morrow and her family may want to discuss her wishes regarding durable power of  attorney and medical decision making, so that she can have input into these choices. If they require legal assistance with this, long-term care resource access, or other aspects of estate planning, they could reach out to [employer] at [phone] for a free consultation. Additionally, they may wish to discuss future plans for caretaking and seek out community options for in home/residential care should they become necessary.  Ms. Salvaggio is encouraged to attend to lifestyle factors for brain health (e.g., regular physical exercise, good nutrition habits and consideration of the MIND-DASH diet, regular participation in cognitively-stimulating activities, and general stress management techniques), which are likely to have benefits for both emotional adjustment and cognition. Optimal control of vascular risk factors (including safe cardiovascular exercise and adherence to dietary recommendations) is encouraged. Continued participation in activities which provide mental stimulation and social interaction is also recommended.   Important information should be provided to Ms. Dehaas in written format in all instances. This information should be placed in a highly frequented and easily visible location within her home to promote recall. External strategies such as written notes in a consistently used memory journal, visual and nonverbal auditory cues such as a calendar on the refrigerator or appointments with alarm, such as on a cell phone, can also help maximize recall.  To address problems with processing speed, she may wish to consider:   -Ensuring that she is alerted when essential material or instructions are being presented   -Adjusting the speed at which new information is presented   -Allowing for more time in comprehending, processing, and responding in conversation   -Repeating and paraphrasing instructions or conversations aloud  To address problems with fluctuating attention and/or  executive dysfunction, she may wish to consider:   -Avoiding external distractions when needing to concentrate   -Limiting exposure to fast paced environments with multiple sensory demands   -Writing  down complicated information and using checklists   -Attempting and completing one task at a time (i.e., no multi-tasking)   -Verbalizing aloud each step of a task to maintain focus   -Taking frequent breaks during the completion of steps/tasks to avoid fatigue   -Reducing the amount of information considered at one time   -Scheduling more difficult activities for a time of day where she is usually most alert  Review of Records:   Ms. Chastang was seen by [facility] Shon Millet, D.O.) on 09/19/2020 following a hospitalization for delirium. Briefly, during the evening of 08/04/2020, she did not recognize her husband, ultimately becoming agitated, not wanting him to sleep in the same bed, and eventually asked him to leave the house. This persisted the following morning. She did recognize her son and he and her husband brought Ms. Khamis to the ED. CMP showed normal kidney and liver function with no electrolyte abnormalities. TSH was 1.687. WBC on CBC was normal. B12, folate and RPR were reportedly normal. Her UA was positive for an underlying UTI. She was discharged on antibiotics. Over the next several weeks, symptoms slowly improved but had not yet fully resolved at the time she met with Dr. Everlena Cooper. Prominent ADL dysfunction was not noted; however, she had stopped driving since these symptoms originally emerged. Performance on a brief cognitive screening instrument (SLUMS) was 17/30.   She completed a neuropsychological evaluation [facility] Heron) on 10/28/2020. At that time, results suggested diffuse cognitive dysfunction with greatest areas of impairment surrounding executive functioning and encoding (i.e., learning) aspects of memory. 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  The underlying etiology was said to be unclear at that time, with testing not strongly suggesting Alzheimer's disease. Repeat testing in 12 months was recommended given the relative recency of her UTI and delirious experience.   She was most recently seen by [facility] [name], PA-C) on 12/07/2022 for ongoing follow-up of cognitive dysfunction. She reported ongoing short-term memory dysfunction, as well as instances where she has trouble recalling details of recent conversations or the names of familiar individuals. She reported some repetition in conversation, as well as difficulty completing her sentences. Functionally, her husband has taken over medication management, financial management, and bill paying responsibilities. She stopped driving around the time of her November 2021 hospitalization and has not returned. Performance on a brief cognitive screening instrument (MMSE) was 22/30. Ultimately, Ms. Stokely was referred for a repeat neuropsychological evaluation to characterize her cognitive abilities and to assist with diagnostic clarity and treatment planning.   Brain MRI on 02/03/2021 revealed mild cerebral atrophy without lobar predominance and mild microvascular ischemic disease. A PET scan on 03/10/2021 revealed decreased cortical metabolism within the left parietal lobe. Brain MRI on 07/15/2022 was stable with her prior MRI. DaTscan on 09/29/2022 revealed decreased radiotracer activity in the left and right striatum (L > R).   Past Medical History:  Diagnosis Date   AK (actinic keratosis) 09/17/2015   Arthritis    Bulging lumbar disc    Bursitis of hip    Family hx of colon cancer 02/17/2018   Fibromyalgia    GERD (gastroesophageal reflux disease)    Hormone replacement therapy (HRT) 09/05/2013   Hypercholesteremia    Hypothyroidism    Mild cognitive impairment of uncertain or unknown etiology 10/28/2020   Parkinsonism    Plantar fasciitis, bilateral    Rectocele 10/12/2017    Rosacea    Vaginal dryness 10/12/2017    Past Surgical History:  Procedure Laterality Date   BACK SURGERY  05/2015   CHOLECYSTECTOMY     COLONOSCOPY N/A 11/10/2012   Procedure: COLONOSCOPY;  Surgeon: Malissa Hippo, MD;  Location: [facility];  Service: Endoscopy;  Laterality: N/A;  730   COLONOSCOPY WITH PROPOFOL N/A 04/08/2018   Procedure: COLONOSCOPY WITH PROPOFOL;  Surgeon: Malissa Hippo, MD;  Location: [facility];  Service: Endoscopy;  Laterality: N/A;  100   TONSILLECTOMY      Current Outpatient Medications:    acetaminophen (TYLENOL) 500 MG tablet, Take 500 mg by mouth as needed for mild pain. , Disp: , Rfl:    carbidopa-levodopa (SINEMET) 25-100 MG tablet, take 1 tablet three times daily, at least 30 minutes before meals, Disp: 180 tablet, Rfl: 3   cholecalciferol (VITAMIN D3) 25 MCG (1000 UNIT) tablet, Take 1,000 Units by mouth daily., Disp: , Rfl:    escitalopram (LEXAPRO) 5 MG tablet, Take 1 tablet (5 mg total) by mouth daily. (Patient not taking: Reported on 12/07/2022), Disp: 30 tablet, Rfl: 3   Famotidine (PEPCID AC PO), Take by mouth daily., Disp: , Rfl:    levothyroxine (SYNTHROID, LEVOTHROID) 50 MCG tablet, Take 50 mcg by mouth daily., Disp: , Rfl:    loratadine (CLARITIN) 10 MG tablet, Take 10 mg by mouth daily as needed for allergies., Disp: , Rfl:    memantine (NAMENDA) 10 MG tablet, Take 1 tablet twice a day, Disp: 60 tablet, Rfl: 11   Probiotic Product (ALIGN) 4 MG CAPS, Take 4 mg by mouth 2 (two) times a week. (Patient not taking: Reported on 12/07/2022), Disp: , Rfl:   Clinical Interview:   The following information was obtained during a clinical interview with Ms. Delahoz and her husband prior to cognitive testing.  Cognitive Symptoms: Decreased short-term memory: Endorsed. They described ongoing difficulties recalling details of recent conversations and names of even familiar individuals. She noted trouble misplacing or losing things in her environment.  Her husband added some repetition in day-to-day conversation. Difficulties have been present for the past several years. While she may have some fluctuations in alertness, her husband reported an overall negative progression over time.  Decreased long-term memory: Denied. Decreased attention/concentration: Endorsed. They reported trouble with sustained attention and increased distractibility.  Reduced processing speed: Endorsed. Difficulties with executive functions: Endorsed. They reported trouble with organization, multi-tasking, and decision making. They denied trouble with impulsivity or any severe personality changes.  Difficulties with emotion regulation: Denied. Difficulties with receptive language: Denied. Difficulties with word finding: Endorsed. Her husband noted that she has far greater trouble finishing her sentences and can lose her train of thought due to word finding difficulties.  Decreased visuoperceptual ability: Unclear.   Difficulties completing ADLs: Endorsed. Her husband has fully taken over all medication, financial, and bill paying responsibilities. She no longer drives due to cognitive concerns.   Additional Medical History: History of traumatic brain injury/concussion: Denied. History of stroke: Denied. History of seizure activity: Denied. History of known exposure to toxins: Denied. Symptoms of chronic pain: Denied. Experience of frequent headaches/migraines: Denied. Frequent instances of dizziness/vertigo: Denied.  Sensory changes: Denied.  Balance/coordination difficulties: Endorsed. She described her balance as "not good," noting that she exhibited greater unsteadiness and instability surrounding the left side of her body. She experienced a syncopal episode about one week prior to the current evaluation where she unexpectedly fell and experienced a loss in consciousness and urination. Her husband noted that he was able to largely catch her as she fell and mitigate  any potential head impact. She was seen in the ED with testing being relatively normal. Other motor difficulties: Endorsed. She experiences bilateral hand tremors, with her right hand being somewhat worse than her left. Her husband also noted some infrequent spasmodic movements, especially while laying down. Medications were said to be helpful at lessening the impact of ongoing tremors.   Sleep History: Estimated hours obtained each night: Unclear. She was unable to provide a numerical estimation.  Difficulties falling asleep: Unclear.  Difficulties staying asleep: Unclear.  Feels rested and refreshed upon awakening: Denied. She reported commonly waking feeling exhausted.   History of snoring: Denied. History of waking up gasping for air: Denied. Witnessed breath cessation while asleep: Denied.  History of vivid dreaming: Denied. Excessive movement while asleep: Endorsed. Her husband noted that she will "every now and then" kick or experience some jerking or spasmodic behaviors. He also described isolated instances where she will talk or yell in her sleep.  Instances of acting out her dreams: Denied.  Psychiatric/Behavioral Health History: Depression: She was unable to describe her current mood ("I don't know"). She did not outright report ongoing depressive symptoms and she and her husband denied any known formal diagnoses in the past. Current or remote suicidal ideation, intent, or plan was denied.  Anxiety: Her husband theorized that she has been dealing with some underlying anxiety. Medical records suggest a history of underlying anxiety over the past several years. They denied any formal diagnoses to their knowledge.  Mania: Denied. Trauma History: Denied. Visual/auditory hallucinations: Unclear. Her husband at one point suggested that she had seen animals such as a cat in her environment. She alluded to hallucinations. However, she was unable to describe them, when they occur, or how  often they occur.  Delusional thoughts: Denied.  Tobacco: Denied. Alcohol: She denied current alcohol consumption as well as a history of problematic alcohol abuse or dependence.  Recreational drugs: Denied.  Family History: Problem Relation Age of Onset   Dementia Mother    Cancer Father        throat   Cancer Sister        throat    Cancer Brother        lung   Cancer Paternal Uncle    Cancer Paternal Uncle    Cancer Paternal Uncle    Cancer Paternal Uncle    Heart attack Sister    Diabetes Brother    Cancer Brother        colon   Diabetes Brother    Coronary artery disease Brother    Coronary artery disease Brother    This information was confirmed by Ms. Elahi.  Academic/Vocational History: Highest level of educational attainment: 11 years. She left school after completing the 11th grade to enter the work force. She did eventually earn her GED. She described herself as a good (A/B) student in academic settings. No relative weaknesses were identified.  History of developmental delay: Denied. History of grade repetition: Denied. Enrollment in special education courses: Denied. History of LD/ADHD: Denied.  Employment: Retired. She previously worked in her Cytogeneticist, Museum/gallery conservator type roles.   Evaluation Results:   Behavioral Observations: Ms. Rigdon was accompanied by her husband, arrived to her appointment on time, and was appropriately dressed and groomed. She appeared alert and oriented. She ambulated very slowly, exhibited a somewhat shuffling gait, and benefited from added stability from her husband. Mild tremors were observed in her hands bilaterally. Her affect appeared fairly anxious throughout the  interview, especially when discussing the testing portion. Spontaneous speech was fluent but also limited. While she was able to start out sentences with appropriate pace, she was often unable to finish her sentences, due to confusion, her  losing her train of thought, word finding difficulties, or likely a combination of symptoms. She was tangential at times and her responses to questions were occasionally unrelated to the initial question asked of her. For example, when asked about trouble with executive functioning and multi-tasking, she responded by attempting to discuss her mobility and balance. Thought processes were normal in content. Insight into her cognitive difficulties appeared limited. She was a poor historian during the current interview and I do not feel that Ms. Lanius is able to fully appreciate the extent of ongoing cognitive impairment.   During testing, sustained attention was adequate. Task engagement was initially adequate. However, she attempted to discontinue the evaluation prematurely on several occasions. She did respond positively to encouragement provided by the psychometrist initially. Ultimately, due to waning testing tolerance and increasing fatigue, the evaluation had to be abbreviated. She commonly required instruction repetition or additional explanation. She discontinued one mood questionnaire prematurely and was unwilling to complete it.  Adequacy of Effort: The validity of neuropsychological testing is limited by the extent to which the individual being tested may be assumed to have exerted adequate effort during testing. Ms. Avants expressed her intention to perform to the best of her abilities and exhibited adequate task engagement and persistence. Scores across stand-alone and embedded performance validity measures were below expectation. However, this is believed to be due to true cognitive impairment rather than any attempts to perform poorly. While it may be prudent to interpret results with some degree of caution, I do believe they are a largely valid representation of Ms. Freiberger's current cognitive functioning.  Test Results: Ms. Fantini was extremely disoriented at the time of the current  evaluation. She was unable to state her birthdate, address, or phone number. She was also unable to state the current year, month ("2025"), date, day of the week ("29"), name of the current clinic, or city the current clinic is located in. When asked why she was here, she responded "need to keep an eye on me."  Intellectual abilities based upon educational and vocational attainment were estimated to be in the average range. Premorbid abilities were estimated to be within the average range based upon a single-word reading test.   Processing speed was exceptionally low. Basic attention was well below average. More complex attention (e.g., working memory) was unable to be assessed due to limited testing tolerance and increased fatigue. Cognitive flexibility was exceptionally low. Other aspects of executive functioning were unable to be assessed.  Receptive language abilities were unable to be assessed. She did appear to have difficulty both understanding questions during interview and comprehending instructions across various cognitive tests. Assessed expressive language (e.g., verbal fluency and confrontation naming) was exceptionally low to well below average.     Assessed visuospatial/visuoconstructional abilities were variable, ranging from the exceptionally low to below average normative ranges. Across her drawing of a clock, she drew the outer circle and was able to correctly place the number 12 (outside of the outer circle). She then proceeded to draw the number 0 in locations where the 3, 6, and 9 would be. No other numbers or locations were placed and she was unable to attempt placing the clock hands.    Learning (i.e., encoding) of novel verbal information was exceptionally low. Spontaneous delayed  recall (i.e., retrieval) of previously learned information was also exceptionally low. Retention rates were 0% across a story learning task, 0% across a list learning task, and 0% across a figure drawing  task. Performance across recognition tasks was exceptionally low to well below average, suggesting negligible evidence for information consolidation.   She was unwilling to complete mood-related questionnaires. A screening instrument assessing recent sleep quality suggested the presence of minimal sleep dysfunction.  Tables of Scores:   Note: This summary of test scores accompanies the interpretive report and should not be considered in isolation without reference to the appropriate sections in the text. Descriptors are based on appropriate normative data and may be adjusted based on clinical judgment. Terms such as "Within Normal Limits" and "Outside Normal Limits" are used when a more specific description of the test score cannot be determined. Descriptors refer to the current evaluation only.         Percentile - Normative Descriptor > 98 - Exceptionally High 91-97 - Well Above Average 75-90 - Above Average 25-74 - Average 9-24 - Below Average 2-8 - Well Below Average < 2 - Exceptionally Low        Validity: February 2022 Current  DESCRIPTOR        DCT: --- --- --- Outside Normal Limits  RBANS EI: --- --- --- Outside Normal Limits        Orientation:       Raw Score Raw Score Percentile   NAB Orientation, Form 1 --- 10/29 --- ---        Cognitive Screening:       Raw Score Raw Score Percentile   SLUMS: --- 5/30 --- ---        RBANS, Form A: Standard Score/ Scaled Score Standard Score/ Scaled Score Percentile   Total Score 74 49 <1 Exceptionally Low  Immediate Memory 57 40 <1 Exceptionally Low    List Learning 3 1 <1 Exceptionally Low    Story Memory 3 1 <1 Exceptionally Low  Visuospatial/Constructional 100 72 3 Well Below Average    Figure Copy 12 7 16  Below Average    Line Orientation 15/20 9/20 <2 Exceptionally Low  Language 85 60 <1 Exceptionally Low    Picture Naming 9/10 8/10 3-9 Well Below Average    Semantic Fluency 4 2 <1 Exceptionally Low  Attention 85 53 <1  Exceptionally Low    Digit Span 8 5 5  Well Below Average    Coding 7 1 <1 Exceptionally Low  Delayed Memory 68 48 <1 Exceptionally Low    List Recall 3/10 0/10 <2 Exceptionally Low    List Recognition 18/20 14/20 <2 Exceptionally Low    Story Recall 5 1 <1 Exceptionally Low    Story Recognition --- 6/12 3-4 Well Below Average    Figure Recall 3 1 <1 Exceptionally Low    Figure Recognition --- 3/8 4-8 Well Below Average         Intellectual Functioning:       Standard Score Standard Score Percentile   Test of Premorbid Functioning: --- 94 34 Average        Attention/Executive Function:      Trail Making Test (TMT): T Score Raw Score (T Score) Percentile     Part A 41 Discontinued (confusion) --- Impaired    Part B 36 Discontinued --- Impaired         Language:      Verbal Fluency Test: T Score Raw Score (T Score) Percentile  Phonemic Fluency (FAS) 22 6 (20) <1 Exceptionally Low    Animal Fluency 37 0 (13) <1 Exceptionally Low         NAB Language Module, Form 1: T Score T Score Percentile     Naming 28/31 (42) 24/31 (32) 4 Well Below Average        Visuospatial/Visuoconstruction:       Raw Score Raw Score Percentile   Clock Drawing: 4/10 3/10 --- Impaired        Mood and Personality:       Raw Score Raw Score Percentile   Beck Depression Inventory - II: --- Not attempted (pt refused) --- ---  PROMIS Anxiety Questionnaire: --- Discontinued --- ---        Additional Questionnaires:       Raw Score Raw Score Percentile   PROMIS Sleep Disturbance Questionnaire: --- 24 --- None to Slight   Informed Consent and Coding/Compliance:   The current evaluation represents a clinical evaluation for the purposes previously outlined by the referral source and is in no way reflective of a forensic evaluation.   Ms. Frees was provided with a verbal description of the nature and purpose of the present neuropsychological evaluation. Also reviewed were the foreseeable risks and/or  discomforts and benefits of the procedure, limits of confidentiality, and mandatory reporting requirements of this provider. The patient was given the opportunity to ask questions and receive answers about the evaluation. Oral consent to participate was provided by the patient.   This evaluation was conducted by [name] Nickels, Ph.D., ABPP-CN, board certified clinical neuropsychologist. Ms. Donahoo completed a clinical interview with Dr. Milbert Coulter, billed as one unit [phone], and 95 minutes of cognitive testing and scoring, billed as one unit [phone] and two additional units [id number]. Psychometrist Wallace Keller, B.S. assisted Dr. Milbert Coulter with test administration and scoring procedures. As a separate and discrete service, one unit [id number] and two units [phone] were billed for Dr. Tammy Sours time spent in interpretation and report writing.

## 2023-04-15 ENCOUNTER — Ambulatory Visit: Payer: PPO | Admitting: Psychology

## 2023-04-15 DIAGNOSIS — F03B0 Unspecified dementia, moderate, without behavioral disturbance, psychotic disturbance, mood disturbance, and anxiety: Secondary | ICD-10-CM | POA: Diagnosis not present

## 2023-04-15 NOTE — Progress Notes (Signed)
   Neuropsychology Feedback Session Natalie Morrow. [facility] [place] Department of Neurology  Reason for Referral:   Natalie Morrow is a 72 y.o. left-handed Caucasian female referred by [name], PA-C, to characterize her current cognitive functioning and assist with diagnostic clarity and treatment planning in the context of a prior mild neurocognitive disorder diagnosis, abnormal PET and DaTscan imaging, and concerns for progressive cognitive and functional decline.   Feedback:   Natalie Morrow completed a comprehensive neuropsychological evaluation on 04/08/2023. Please refer to that encounter for the full report and recommendations. Briefly, results suggested diffuse and extremely severe cognitive impairment. She was able to adequately copy a complex figure. However, all other assessed domains exhibited very severe impairment. Relative to her previous February 2022 evaluation, cognitive decline was exhibited across all assessed cognitive domains. Decline was fairly significant in many cases, suggesting a fairly accelerated rate of change relative to her previous evaluation. No areas saw improvement or consistent stability. Functional decline also seems to have exhibited a fairly rapid decline as Natalie Morrow was not experiencing prominent ADL dysfunction at the time of her previous evaluation outside of driving concerns. Given evidence for cognitive and functional impairment, she meets diagnostic criteria for a Major Neurocognitive Disorder ("dementia") at the present time. Please see her original report for full diagnostic considerations.   Natalie Morrow was accompanied by her husband during the current feedback session. Content of the current session focused on the results of her neuropsychological evaluation. Natalie Morrow was given the opportunity to ask questions and her questions were answered. She was encouraged to reach out should additional questions arise. A copy of her report was  provided at the conclusion of the visit.      One unit [phone] was billed for Dr. Tammy Sours time spent preparing for, conducting, and documenting the current feedback session with Natalie Morrow.

## 2023-05-11 DIAGNOSIS — E7849 Other hyperlipidemia: Secondary | ICD-10-CM | POA: Diagnosis not present

## 2023-05-11 DIAGNOSIS — E782 Mixed hyperlipidemia: Secondary | ICD-10-CM | POA: Diagnosis not present

## 2023-05-11 DIAGNOSIS — E039 Hypothyroidism, unspecified: Secondary | ICD-10-CM | POA: Diagnosis not present

## 2023-05-11 DIAGNOSIS — N183 Chronic kidney disease, stage 3 unspecified: Secondary | ICD-10-CM | POA: Diagnosis not present

## 2023-05-11 DIAGNOSIS — E1122 Type 2 diabetes mellitus with diabetic chronic kidney disease: Secondary | ICD-10-CM | POA: Diagnosis not present

## 2023-05-11 DIAGNOSIS — I10 Essential (primary) hypertension: Secondary | ICD-10-CM | POA: Diagnosis not present

## 2023-05-18 DIAGNOSIS — F0390 Unspecified dementia without behavioral disturbance: Secondary | ICD-10-CM | POA: Diagnosis not present

## 2023-05-18 DIAGNOSIS — R42 Dizziness and giddiness: Secondary | ICD-10-CM | POA: Diagnosis not present

## 2023-05-18 DIAGNOSIS — M722 Plantar fascial fibromatosis: Secondary | ICD-10-CM | POA: Diagnosis not present

## 2023-05-18 DIAGNOSIS — T466X5A Adverse effect of antihyperlipidemic and antiarteriosclerotic drugs, initial encounter: Secondary | ICD-10-CM | POA: Diagnosis not present

## 2023-05-18 DIAGNOSIS — N1831 Chronic kidney disease, stage 3a: Secondary | ICD-10-CM | POA: Diagnosis not present

## 2023-05-18 DIAGNOSIS — G3183 Dementia with Lewy bodies: Secondary | ICD-10-CM | POA: Diagnosis not present

## 2023-05-18 DIAGNOSIS — M791 Myalgia, unspecified site: Secondary | ICD-10-CM | POA: Diagnosis not present

## 2023-05-18 DIAGNOSIS — R7301 Impaired fasting glucose: Secondary | ICD-10-CM | POA: Diagnosis not present

## 2023-05-18 DIAGNOSIS — R030 Elevated blood-pressure reading, without diagnosis of hypertension: Secondary | ICD-10-CM | POA: Diagnosis not present

## 2023-05-18 DIAGNOSIS — E038 Other specified hypothyroidism: Secondary | ICD-10-CM | POA: Diagnosis not present

## 2023-05-18 DIAGNOSIS — Z23 Encounter for immunization: Secondary | ICD-10-CM | POA: Diagnosis not present

## 2023-05-18 DIAGNOSIS — E7849 Other hyperlipidemia: Secondary | ICD-10-CM | POA: Diagnosis not present

## 2023-06-09 ENCOUNTER — Encounter: Payer: Self-pay | Admitting: Physician Assistant

## 2023-06-09 ENCOUNTER — Ambulatory Visit: Payer: PPO | Admitting: Physician Assistant

## 2023-06-09 VITALS — BP 108/63 | HR 83 | Resp 18 | Ht 65.0 in | Wt 124.0 lb

## 2023-06-09 DIAGNOSIS — F03B0 Unspecified dementia, moderate, without behavioral disturbance, psychotic disturbance, mood disturbance, and anxiety: Secondary | ICD-10-CM

## 2023-06-09 NOTE — Patient Instructions (Addendum)
Continue Carbidopa levodopa Continue  memantine to 10 mg 2 times a day Follow up in 6 months  Ok with melatonin 3-5 mg, may take up to 10 mg at night   Visit this website: Dementia [facility]  It will help you as a caregiver.

## 2023-06-09 NOTE — Progress Notes (Signed)
Assessment/Plan:   Moderate dementia without behavioral disturbance, unclear etiology  Natalie Morrow is a very pleasant 72 y.o. LH female with a history of any diagnosis of moderate dementia without clear etiology per neuropsych evaluation August 2024 (although there was concern of Parkinson's versus LBD in the differentials) seen today in follow up for memory loss. Patient is currently on memantine 10 mg twice daily.  Unfortunately, the patient was unable to tolerate donepezil and Exelon in the past.  Progression of the disease is noted, cognitively, behaviorally.  She has more visual hallucinations, and she has more speech deficiencies than prior.  Discussed with her husband adding medication for sundowning i.e. Depakote, as well as having speech therapy and physical therapy but he politely declines.  He is going to entertain initiating Depakote on her, he is concerned about side effects.  He also mentioned that he may be entertaining restarting her on rivastigmine which she did not tolerate well in the past.  He will let us know if he wants to give another try to in an effort to slow down worsening of her memory.  She is very much aware of what is happening unfortunately.  Her tremors are well-controlled with Sinemet at this time.     Follow up in 6 months. Continue memantine 10 mg twice daily, side effects discussed Recommend good control of her cardiovascular risk factors Continue to control mood, husband to let us know if he wants [place] to start Depakote 125 mg nightly.  He is concerned about any other medications with black box warning such as Seroquel  Continue Sinemet 25 100 mg 3 times daily for     Subjective:    This patient is accompanied in the office by her husband who supplements the history.  Previous records as well as any outside records available were reviewed prior to todays visit. Patient was last seen on 12/07/2022 with MMSE 22/30.   Any changes in memory since last  visit? "Gotten worse". Has a  hard time with the words coming out.  She tried speech therapy in the past, which was not therapeutic.  She has difficulty remembering new information, but mostly is expressing herself what causes her concerns. repeats oneself? "Not a lot because she cannot get it out"-husband says Disoriented when walking into a room?  Sometimes she does not recognize her home as such. Leaving objects in unusual places?  May misplace things but not in unusual places  Wandering behavior?  Denies. She does not like to leave the house.    Any personality changes since last visit?  "Sundownings are worse. If she has a good night, the sundownings are less".  Any worsening depression?:  Denies.   Hallucinations or paranoia?  Sees people in the house. Sometimes she sees husband inside the house when he actually is outside working in the yard.  She may experience visual sometimes. Seizures? denies    Any sleep changes?  Reports vivid dreams, +REM behavior. She gets up when anxious about appointments.  She is able to try melatonin. Sleep apnea?   Denies.   Any hygiene concerns? He has to coach her how to wash or shower, not all the time Independent of bathing and dressing?  Endorsed. The closet is not organized as before   Does the patient needs help with medications? Husband is in charge  Who is in charge of the finances?  Husband is in charge    Any changes in appetite?  Denies.  Patient have trouble swallowing? Denies.   Does the patient cook? No Any headaches?   Denies.   Chronic back pain  denies.   Ambulates with difficulty? When getting up she may be "wobbly" but better during the day.  Her steps are smaller.  She does not use a walker or cane. Recent falls or head injuries?  In July 2024, the patient sustained likely syncopal fall without LOC. All workup including UA and cardiac studies were negative.  No fracture.  "One week ago she had a mechanical fall when her arm caught the  table and she hit the R arm and had bruises, no LOC, may have been orthostatic component.  " unilateral weakness, numbness or tingling? denies   Any tremors?  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  She is on Sinemet 25 100 3 times daily, with good relief. Any anosmia?  Denies   Any incontinence of urine?  Endorsed, needs pads. Not all the time Any bowel dysfunction? She had a couple of accidents.  "She didn't make it fast enough " Patient lives with husband  Does the patient drive? No longer drives    Neuropsych evaluation 04/15/2023 briefly, results suggested diffuse and extremely severe cognitive impairment. She was able to adequately copy a complex figure. However, all other assessed domains exhibited very severe impairment. Relative to her previous February 2022 evaluation, cognitive decline was exhibited across all assessed cognitive domains. Decline was fairly significant in many cases, suggesting a fairly accelerated rate of change relative to her previous evaluation. No areas saw improvement or consistent stability. Functional decline also seems to have exhibited a fairly rapid decline as Natalie Morrow was not experiencing prominent ADL dysfunction at the time of her previous evaluation outside of driving concerns. Given evidence for cognitive and functional impairment, she meets diagnostic criteria for a Major Neurocognitive Disorder ("dementia") at the present time. Please see her original report for full diagnostic considerations.   A prior PET scan in 2022 revealed decreased cortical metabolism in the left parietal lobe, leading some in her medical records to theorize underlying Alzheimer's disease. While temporal and parietal hypometabolism can be worrisome for this illness, typical metabolic changes are first observed in the temporal regions and later spread to parietal regions in Alzheimer's disease. There is  also research to suggest that left parietal metabolic changes in particular are among the regions with the largest metabolic change in healthy individuals (i.e., normal aging process). There remains the potential that this finding represents somewhat of a red herring and that a parkinsonian presentation best fits current data. However, there also remains the potential for a mixed presentation as there can be co-occurring presentations of Alzheimer's disease and Parkinson's disease or another parkinsonian presentation (Alzheimer's disease and Lewy body disease can co-occur around 20-30% of the time for example). It is worth highlighting, given the results of her DaTscan, that Lewy body disease can also have parietal hypometabolism across a PET scan (an occipital/parietal pattern is common).     Initial evaluation 07/28/21  On the evening of 08/04/2020, she did not recognize her husband.  She wasn't agitated but didn't want to sleep in the same bed and asked him to leave the house.  The next morning, she still didn't recognize her husband.  Her son came over and she did recognize her son.  They brought her to the ED.  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  CMP showed normal kidney and liver function with no electrolyte abnormalities.  TSH was 1.687.  WBC on CBC was normal.  UA was positive for UTI.  She was discharged on antibiotics.  B12, folate and RPR reportedly normal.  Over the next several weeks, symptoms slowly improved but have not completely resolved.  She will still sometimes briefly not recognize her husband.  On one occasion, she did not really recognize her son either.  She has still been able to perform all ADLs however she has not been driving since this started.  No headache, fever, cough.  Prior to onset, she had been experiencing dizziness/vertigo.  Also, she appeared to not be as  talkative on the phone.  She has been under increased stress caring for her husband who has been having worsening kidney disease and may need to start dialysis.  EEG on 09/25/2020 showed mild left temporal slowing.  She underwent neuropsychological testing on 10/28/2020 showed mild cognitive deficits in memory and executive function but did not meet criteria for dementia.  PET scan of brain on 03/10/2021 showed decreased relative cortical metabolism within the left parietal lobe as may be seen in Alzheimer's type pathology. Mother and maternal aunt dementia. Her sister has been experiencing symptoms as well.   DaTscan 09/29/2022, shows decreased radiotracer activity in the left and right striata in a pattern suggestive of Parkinson syndrome pathology, greater deficit on the left.    MRI brain 07/15/22 No evidence of acute intracranial abnormality.2. Minimal chronic small-vessel ischemic changes within the cerebral white matter, similar to the prior brain MRI of 02/02/2021.3. Mild generalized cerebral atrophy, stable.4. Mild paranasal sinus disease, as described.    PREVIOUS MEDICATIONS: Donepezil, rivastigmine (GI, tremors)  CURRENT MEDICATIONS:  Outpatient Encounter Medications as of 06/09/2023  Medication Sig   carbidopa-levodopa (SINEMET) 25-100 MG tablet take 1 tablet three times daily, at least 30 minutes before meals   cholecalciferol (VITAMIN D3) 25 MCG (1000 UNIT) tablet Take 1,000 Units by mouth daily.   Famotidine (PEPCID AC PO) Take by mouth daily.   levothyroxine (SYNTHROID, LEVOTHROID) 50 MCG tablet Take 50 mcg by mouth daily.   loratadine (CLARITIN) 10 MG tablet Take 10 mg by mouth daily as needed for allergies.   memantine (NAMENDA) 10 MG tablet Take 1 tablet twice a day   Probiotic Product (ALIGN) 4 MG CAPS Take 4 mg by mouth 2 (two) times a week. (Patient not taking: Reported on 12/07/2022)   No facility-administered encounter medications on file as of 06/09/2023.       12/07/2022     8:00 PM 06/24/2022   11:46 AM 09/19/2021    8:00 AM  MMSE - Mini Mental State Exam  Orientation to time 2 2 3   Orientation to Place 5 3 5   Registration 3 3 3   Attention/ Calculation 2 5 2   Recall 3 2 2   Language- name 2 objects 2 2 2   Language- repeat 1 1 1   Language- follow 3 step command 3 2 3   Language- read & follow direction 1 1 1   Write a sentence 0 0 0  Copy design 0 0 0  Total score 22 21 22        No data to display          Objective:     PHYSICAL EXAMINATION:    VITALS:   Vitals:   06/09/23 1243  BP: 108/63  Pulse: 83  Resp: 18  SpO2: 97%  Weight: 124 lb (56.2 kg)  Height: 5\' 5"  (1.651 m)    GEN:  The patient appears stated age and is in NAD. HEENT:  Normocephalic, atraumatic.   Neurological examination:  General: NAD, well-groomed, appears stated age. Orientation: The patient is alert. Oriented to person, place and not to date Cranial nerves: There is good facial symmetry.The speech is nonfluent but clear.  There is aphasia without dysarthria. Fund of knowledge is reduced.  Recent and remote memory are impaired. Attention and concentration are reduced.  Able to know the objects, but he is unable to say it fast, needs time.  Unable to repeat phrases rapidly due to same.   Hearing is intact to conversational tone.  Sensation: Sensation is intact to light touch throughout Motor: Strength is at least antigravity x4. DTR's 2/4 in UE/LE     Movement examination: Tone: There is normal tone in the UE/LE Abnormal movements: Mild right greater than left upper extremity intention tremors, no facial tremor. No myoclonus.  No asterixis.   Coordination:  There is no decremation with RAM's. Normal finger to nose  Gait and Station: The patient has some difficulty arising out of a deep-seated chair without the use of the hands. The patient's stride length is short.  She leans to the left, stride is short, gait cautious and narrow.   Thank you for allowing [place] the  opportunity to participate in the care of this nice patient. Please do not hesitate to contact us for any questions or concerns.   Total time spent on today's visit was 44 minutes dedicated to this patient today, preparing to see patient, examining the patient, ordering tests and/or medications and counseling the patient, documenting clinical information in the EHR or other health record, independently interpreting results and communicating results to the patient/family, discussing treatment and goals, answering patient's questions and coordinating care.  Cc:  Estanislado Pandy, MD  [name] 06/09/2023 2:49 PM

## 2023-07-13 ENCOUNTER — Other Ambulatory Visit: Payer: Self-pay | Admitting: Physician Assistant

## 2023-08-31 ENCOUNTER — Telehealth: Payer: Self-pay | Admitting: Physician Assistant

## 2023-08-31 ENCOUNTER — Other Ambulatory Visit: Payer: Self-pay | Admitting: Physician Assistant

## 2023-08-31 MED ORDER — DIVALPROEX SODIUM 125 MG PO DR TAB: 125.0000 mg | DELAYED_RELEASE_TABLET | Freq: Every evening | ORAL | 3 refills | Status: DC

## 2023-08-31 NOTE — Telephone Encounter (Signed)
Caller stated that they discussed with [facility] about getting a script for medication to help with sleep. Caller stated she would like to try the medication and would also like to speak with [facility]

## 2023-10-04 ENCOUNTER — Encounter: Payer: Self-pay | Admitting: Physician Assistant

## 2023-11-03 ENCOUNTER — Other Ambulatory Visit: Payer: Self-pay | Admitting: Physician Assistant

## 2023-11-16 DIAGNOSIS — E782 Mixed hyperlipidemia: Secondary | ICD-10-CM | POA: Diagnosis not present

## 2023-11-16 DIAGNOSIS — E1122 Type 2 diabetes mellitus with diabetic chronic kidney disease: Secondary | ICD-10-CM | POA: Diagnosis not present

## 2023-11-16 DIAGNOSIS — N183 Chronic kidney disease, stage 3 unspecified: Secondary | ICD-10-CM | POA: Diagnosis not present

## 2023-11-16 DIAGNOSIS — E039 Hypothyroidism, unspecified: Secondary | ICD-10-CM | POA: Diagnosis not present

## 2023-11-16 DIAGNOSIS — I10 Essential (primary) hypertension: Secondary | ICD-10-CM | POA: Diagnosis not present

## 2023-11-16 DIAGNOSIS — E7849 Other hyperlipidemia: Secondary | ICD-10-CM | POA: Diagnosis not present

## 2023-11-23 DIAGNOSIS — E782 Mixed hyperlipidemia: Secondary | ICD-10-CM | POA: Diagnosis not present

## 2023-11-23 DIAGNOSIS — Z6820 Body mass index (BMI) 20.0-20.9, adult: Secondary | ICD-10-CM | POA: Diagnosis not present

## 2023-11-23 DIAGNOSIS — N183 Chronic kidney disease, stage 3 unspecified: Secondary | ICD-10-CM | POA: Diagnosis not present

## 2023-11-23 DIAGNOSIS — Z23 Encounter for immunization: Secondary | ICD-10-CM | POA: Diagnosis not present

## 2023-11-23 DIAGNOSIS — I10 Essential (primary) hypertension: Secondary | ICD-10-CM | POA: Diagnosis not present

## 2023-11-23 DIAGNOSIS — Z0001 Encounter for general adult medical examination with abnormal findings: Secondary | ICD-10-CM | POA: Diagnosis not present

## 2023-11-23 DIAGNOSIS — E039 Hypothyroidism, unspecified: Secondary | ICD-10-CM | POA: Diagnosis not present

## 2023-11-23 DIAGNOSIS — Z9189 Other specified personal risk factors, not elsewhere classified: Secondary | ICD-10-CM | POA: Diagnosis not present

## 2023-11-24 ENCOUNTER — Other Ambulatory Visit: Payer: Self-pay | Admitting: Physician Assistant

## 2023-12-02 ENCOUNTER — Other Ambulatory Visit: Payer: Self-pay | Admitting: Physician Assistant

## 2023-12-08 ENCOUNTER — Ambulatory Visit: Payer: PPO | Admitting: Physician Assistant

## 2023-12-09 ENCOUNTER — Encounter: Payer: Self-pay | Admitting: Physician Assistant

## 2023-12-09 ENCOUNTER — Ambulatory Visit: Payer: PPO | Admitting: Physician Assistant

## 2023-12-09 VITALS — BP 99/58 | HR 86 | Resp 18 | Ht 65.0 in | Wt 127.0 lb

## 2023-12-09 DIAGNOSIS — F03B0 Unspecified dementia, moderate, without behavioral disturbance, psychotic disturbance, mood disturbance, and anxiety: Secondary | ICD-10-CM | POA: Diagnosis not present

## 2023-12-09 MED ORDER — DIVALPROEX SODIUM 125 MG PO DR TAB: DELAYED_RELEASE_TABLET | ORAL | 3 refills | Status: DC

## 2023-12-09 MED ORDER — MEMANTINE HCL 10 MG PO TABS
10.0000 mg | ORAL_TABLET | Freq: Two times a day (BID) | ORAL | 3 refills | Status: DC
Start: 2023-12-09 — End: 2024-06-12

## 2023-12-09 MED ORDER — CARBIDOPA-LEVODOPA 25-100 MG PO TABS: ORAL_TABLET | ORAL | 3 refills | Status: DC

## 2023-12-09 NOTE — Progress Notes (Signed)
 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 LH female with a history ofmoderate dementia without clear etiology per neuropsych evaluation August 2024 (although there was concern of Parkinson's versus LBD in the differentials) seen today in follow up for memory loss. Patient is currently on memantine 10 mg bid (unable to tolerate rivastigmine, donepezil) . Cognitive decline is noted. Mood is controlled with Depakote. Tremors controlled with Sinemet.    Follow up in 6 months. Continue Memantine 10 mg twice daily. Side effects were discussed  Continue Depakote 250 mg nightly for sundowning, hallucinations Continue Sinemet 25-100 mg tid  Recommend good control of her cardiovascular risk factors Continue to control mood as per PCP     Subjective:    This patient is accompanied in the office by her husband who supplements the history.  Previous records as well as any outside records available were reviewed prior to todays visit. Patient was last seen on 06/09/23 with last MMSE on 11/2022 22/30     Any changes in memory since last visit? " It is worse, some good and bad days". Difficulty retrieving words. Her sister in law helps when he has to run some errands. Disoriented when walking into a room?  Sometimes she does not recognize her house  Leaving objects?  May misplace things but not in unusual places, toothbrush in the drawer, etc.   Wandering behavior?  Denies. Does not like leaving her house.  Any personality changes since last visit?  She has episodes of sundowning, controlled with Depakote. She hoards napkins and places them everywhere.  Any worsening depression?:  Some days is good, some days is not.   Hallucinations or paranoia?  She sees people in the house.  Seizures? denies    Any sleep changes? She has vivid dreams, and REM behavior. No  sleepwalking   Sleep apnea?   Denies.    Any hygiene concerns?  Husband has to coach her.   Independent of bathing and dressing?  She needs assistance Does the patient needs help with medications? Husband  is in charge   Who is in charge of the finances?  Husband is in charge    Any changes in appetite?  Denies.   Patient have trouble swallowing? Denies.   Does the patient cook? No Any headaches?   denies   Chronic back pain  denies   Ambulates with difficulty? Denies.   Recent falls or head injuries? denies     Unilateral weakness, numbness or tingling? denies   Any tremors?  History of parkinson's Syndrome with R>L intention tremor. No resting tremor Any anosmia?  Denies   Any incontinence of urine?  Endorsed, wears diapers Any bowel dysfunction?  She has some bowel incontinence Patient lives with husband   Does the patient drive? No longer drives    Neuropsych evaluation 04/15/2023 briefly, results suggested diffuse and extremely severe cognitive impairment. She was able to adequately copy a complex figure. However, all other assessed domains exhibited very severe impairment. Relative to her previous February 2022 evaluation, cognitive decline was exhibited across all assessed cognitive domains. Decline was fairly significant in many cases, suggesting a fairly accelerated rate of change relative to her previous evaluation. No areas saw improvement or consistent stability. Functional decline also seems to have exhibited a fairly rapid decline as Ms. Boothe was not experiencing prominent ADL dysfunction at the time of her previous evaluation outside of driving  concerns. Given evidence for cognitive and functional impairment, she meets diagnostic criteria for a Major Neurocognitive Disorder ("dementia") at the present time. Please see her original report for full diagnostic considerations.    A prior PET scan in 2022 revealed decreased cortical metabolism in the left parietal lobe, leading some in her medical records to theorize  underlying Alzheimer's disease. While temporal and parietal hypometabolism can be worrisome for this illness, typical metabolic changes are first observed in the temporal regions and later spread to parietal regions in Alzheimer's disease. There is also research to suggest that left parietal metabolic changes in particular are among the regions with the largest metabolic change in healthy individuals (i.e., normal aging process). There remains the potential that this finding represents somewhat of a red herring and that a parkinsonian presentation best fits current data. However, there also remains the potential for a mixed presentation as there can be co-occurring presentations of Alzheimer's disease and Parkinson's disease or another parkinsonian presentation (Alzheimer's disease and Lewy body disease can co-occur around 20-30% of the time for example). It is worth highlighting, given the results of her DaTscan, that Lewy body disease can also have parietal hypometabolism across a PET scan (an occipital/parietal pattern is common).       Initial evaluation 07/28/21  On the evening of 08/04/2020, she did not recognize her husband.  She wasn't agitated but didn't want to sleep in the same bed and asked him to leave the house.  The next morning, she still didn't recognize her husband.  Her son came over and she did recognize her son.  They brought her to the ED.  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  CMP showed normal kidney and liver function with no electrolyte abnormalities.  TSH was 1.687.  WBC on CBC was normal.  UA was positive for UTI.  She was discharged on antibiotics.  B12, folate and RPR reportedly normal.  Over the next several weeks, symptoms slowly improved but have not completely resolved.  She will still sometimes briefly not recognize her husband.  On one occasion, she did not  really recognize her son either.  She has still been able to perform all ADLs however she has not been driving since this started.  No headache, fever, cough.  Prior to onset, she had been experiencing dizziness/vertigo.  Also, she appeared to not be as talkative on the phone.  She has been under increased stress caring for her husband who has been having worsening kidney disease and may need to start dialysis.  EEG on 09/25/2020 showed mild left temporal slowing.  She underwent neuropsychological testing on 10/28/2020 showed mild cognitive deficits in memory and executive function but did not meet criteria for dementia.  PET scan of brain on 03/10/2021 showed decreased relative cortical metabolism within the left parietal lobe as may be seen in Alzheimer's type pathology. Mother and maternal aunt dementia. Her sister has been experiencing symptoms as well.   DaTscan 09/29/2022, shows decreased radiotracer activity in the left and right striata in a pattern suggestive of Parkinson syndrome pathology, greater deficit on the left.    MRI brain 07/15/22 No evidence of acute intracranial abnormality.2. Minimal chronic small-vessel ischemic changes within the cerebral white matter, similar to the prior brain MRI of 02/02/2021.3. Mild generalized cerebral atrophy, stable.4. Mild paranasal sinus disease, as described.     PREVIOUS MEDICATIONS: Donepezil, rivastigmine (GI, tremors)  CURRENT MEDICATIONS:  Outpatient Encounter Medications as of 12/09/2023  Medication Sig   cholecalciferol (VITAMIN D3) 25 MCG (1000 UNIT) tablet Take 1,000 Units by mouth daily.   Famotidine (PEPCID AC PO) Take by mouth daily.   levothyroxine (SYNTHROID, LEVOTHROID) 50 MCG tablet Take 50 mcg by mouth daily.   loratadine (CLARITIN) 10 MG tablet Take 10 mg by mouth daily as needed for allergies.   [DISCONTINUED] carbidopa-levodopa (SINEMET IR) 25-100 MG tablet TAKE ONE TABLET BY MOUTH THREE TIMES DAILY; AT LEAST 30 MINUTES PRIOR TO  MEALS.   [DISCONTINUED] divalproex (DEPAKOTE) 125 MG DR tablet TAKE 1 TABLET(125 MG) BY MOUTH AT BEDTIME   [DISCONTINUED] memantine (NAMENDA) 10 MG tablet TAKE ONE TABLET BY MOUTH TWICE DAILY   carbidopa-levodopa (SINEMET IR) 25-100 MG tablet TAKE ONE TABLET BY MOUTH THREE TIMES DAILY; AT LEAST 30 MINUTES PRIOR TO MEALS.   divalproex (DEPAKOTE) 125 MG DR tablet Take 2 tabs at night (250 mg) for sundowning   memantine (NAMENDA) 10 MG tablet Take 1 tablet (10 mg total) by mouth 2 (two) times daily.   Probiotic Product (ALIGN) 4 MG CAPS Take 4 mg by mouth 2 (two) times a week. (Patient not taking: Reported on 12/07/2022)   No facility-administered encounter medications on file as of 12/09/2023.       12/07/2022    8:00 PM 06/24/2022   11:46 AM 09/19/2021    8:00 AM  MMSE - Mini Mental State Exam  Orientation to time 2 2 3   Orientation to Place 5 3 5   Registration 3 3 3   Attention/ Calculation 2 5 2   Recall 3 2 2   Language- name 2 objects 2 2 2   Language- repeat 1 1 1   Language- follow 3 step command 3 2 3   Language- read & follow direction 1 1 1   Write a sentence 0 0 0  Copy design 0 0 0  Total score 22 21 22        No data to display          Objective:     PHYSICAL EXAMINATION:    VITALS:   Vitals:   12/09/23 1418  BP: (!) 99/58  Pulse: 86  Resp: 18  SpO2: 95%  Weight: 127 lb (57.6 kg)  Height: 5\' 5"  (1.651 m)    GEN:  The patient appears stated age and is in NAD. HEENT:  Normocephalic, atraumatic.   Neurological examination:  General: NAD, well-groomed, appears stated age. Orientation: The patient is alert. Oriented to person, not to place and date Cranial nerves: There is good facial symmetry.The speech is fluent and clear. No aphasia or dysarthria. Fund of knowledge is reduced. Recent and remote memory are impaired. Attention and concentration are reduced.  Able to name objects and repeat phrases.  Hearing is intact to conversational tone.   Sensation:  Sensation is intact to light touch throughout Motor: Strength is at least antigravity x4. DTR's 2/4 in UE/LE     Movement examination: Tone: There is mildly increased  tone in the UE/LE. MILd B cogwheeling Abnormal movements: R>L intention tremor.  No myoclonus.  No asterixis.   Coordination:  There is decremation with RAM'sAbnormal  finger to nose with R arm neglect Gait and Station: The patient has some difficulty arising out of a deep-seated chair without the use of the hands. The patient's stride length is short.  Gait is cautious and narrow.    Thank you for allowing [place] the opportunity to participate in the care of this  nice patient. Please do not hesitate to contact us for any questions or concerns.   Total time spent on today's visit was 34 minutes dedicated to this patient today, preparing to see patient, examining the patient, ordering tests and/or medications and counseling the patient, documenting clinical information in the EHR or other health record, independently interpreting results and communicating results to the patient/family, discussing treatment and goals, answering patient's questions and coordinating care.  Cc:  Estanislado Pandy, MD  [name] 12/09/2023 2:53 PM

## 2023-12-09 NOTE — Patient Instructions (Signed)
 Continue Carbidopa levodopa Continue  memantine to 10 mg 2 times a day Continue Depakote, may increase to 250 mg (2 of the 125 mg pills) at night  Follow up in 6 months  Ok with melatonin 3-5 mg, may take up to 10 mg at night   Visit this website: [facility]  It will help you as a caregiver.

## 2024-05-17 DIAGNOSIS — R7301 Impaired fasting glucose: Secondary | ICD-10-CM | POA: Diagnosis not present

## 2024-05-17 DIAGNOSIS — E7849 Other hyperlipidemia: Secondary | ICD-10-CM | POA: Diagnosis not present

## 2024-05-17 DIAGNOSIS — E1122 Type 2 diabetes mellitus with diabetic chronic kidney disease: Secondary | ICD-10-CM | POA: Diagnosis not present

## 2024-05-17 DIAGNOSIS — N183 Chronic kidney disease, stage 3 unspecified: Secondary | ICD-10-CM | POA: Diagnosis not present

## 2024-05-17 DIAGNOSIS — I10 Essential (primary) hypertension: Secondary | ICD-10-CM | POA: Diagnosis not present

## 2024-05-24 DIAGNOSIS — Z23 Encounter for immunization: Secondary | ICD-10-CM | POA: Diagnosis not present

## 2024-05-24 DIAGNOSIS — R7301 Impaired fasting glucose: Secondary | ICD-10-CM | POA: Diagnosis not present

## 2024-05-24 DIAGNOSIS — I10 Essential (primary) hypertension: Secondary | ICD-10-CM | POA: Diagnosis not present

## 2024-05-24 DIAGNOSIS — Z6820 Body mass index (BMI) 20.0-20.9, adult: Secondary | ICD-10-CM | POA: Diagnosis not present

## 2024-05-24 DIAGNOSIS — E7849 Other hyperlipidemia: Secondary | ICD-10-CM | POA: Diagnosis not present

## 2024-05-24 DIAGNOSIS — E782 Mixed hyperlipidemia: Secondary | ICD-10-CM | POA: Diagnosis not present

## 2024-05-24 DIAGNOSIS — E039 Hypothyroidism, unspecified: Secondary | ICD-10-CM | POA: Diagnosis not present

## 2024-06-12 ENCOUNTER — Encounter: Payer: Self-pay | Admitting: Physician Assistant

## 2024-06-12 ENCOUNTER — Ambulatory Visit: Admitting: Physician Assistant

## 2024-06-12 VITALS — BP 103/69 | HR 95 | Ht 66.0 in | Wt 123.0 lb

## 2024-06-12 DIAGNOSIS — G20A1 Parkinson's disease without dyskinesia, without mention of fluctuations: Secondary | ICD-10-CM

## 2024-06-12 DIAGNOSIS — F03B0 Unspecified dementia, moderate, without behavioral disturbance, psychotic disturbance, mood disturbance, and anxiety: Secondary | ICD-10-CM

## 2024-06-12 MED ORDER — DIVALPROEX SODIUM 250 MG PO DR TAB: DELAYED_RELEASE_TABLET | ORAL | 3 refills | Status: DC

## 2024-06-12 NOTE — Progress Notes (Signed)
 Assessment/Plan:   Dementia without behavioral disturbance, concern for AD and Parkinson syndrome  Natalie Morrow is a very pleasant 73 y.o. LH female with a history of moderate dementia without clear etiology per neuropsych evaluation August 2024 (although there was concern of Parkinson's versus LBD in the differentials) seen today in follow up for memory loss. Patient is currently on memantine  10 mg twice daily (unable to tolerate rivastigmine , donepezil ).  Cognitive decline is noted.  Discussed with her husband discontinuing memantine  as this is no longer therapeutic, husband agrees. Mood is controlled with Depakote  250 mg at night.  Tremors are controlled with Sinemet  3 times daily.  She needs more assistance with ADLs, has a caregiver   Follow up in 6 months. Continue memantine  10 mg twice a day, side effects discussed Continue Depakote  250 mg nightly for sundowning, hallucinations Continue Sinemet  25-100 mg 3 times daily Recommend good control of her cardiovascular risk factors Continue to control mood as per PCP     Subjective:    This patient is accompanied in the office by her husband who supplements the history.  Previous records as well as any outside records available were reviewed prior to todays visit. Patient was last seen on 12/09/2023   Any changes in memory since last visit?   Some good and bad days .  She also has difficulty retrieving  and comprehending the words . Her sister-in-law helps when she has to run some errands. Husband is the main caregiver repeats oneself?  Endorsed Disoriented when walking into a room?  That she does not recognize her house Leaving objects?  May misplace things such as toothbrush in the drawer, etc. she hoards napkins and places them everywhere  Wandering behavior?  denies.  She does not like leaving her house Any personality changes since last visit?  She has episodes of sundowning controlled with Depakote  at night. Any worsening  depression?:  Denies.  There are some good and bad days  hallucinations or paranoia?  She sees people in the house. Seizures? denies    Any sleep changes?  She sleeps well. She has vivid dreams, REM behavior, denies sleepwalking   Sleep apnea?   Denies.   Any hygiene concerns?  She needs coaching . I wash her when she sits down.  Independent of bathing and dressing?  Her husband assists her. Does the patient needs help with medications?  Husband is in charge Who is in charge of the finances?   Husband is in charge Any changes in appetite?  She is not eating as well as she did  Patient have trouble swallowing? Denies.   Does the patient cook? No Any headaches?   denies   Any vision changes?  Denies Chronic back pain  denies   Ambulates with difficulty?  She walks small steps, needs help to ambulate for stability. Recent falls or head injuries? Denies.     Unilateral weakness, numbness or tingling? denies   Any tremors?  She has history of Parkinson's syndrome, with R >L intention tremor, no resting tremor.  No changes from prior visit Any anosmia?  Denies   Any incontinence of urine?  Endorsed, wears diapers   Any bowel dysfunction?  Endorsed, she has issues with constipation, takes Miralax.   Patient lives with her husband Does the patient drive? No longer drives    Neuropsych evaluation 04/15/2023 briefly, results suggested diffuse and extremely severe cognitive impairment. She was able to adequately copy a complex figure. However, all other assessed  domains exhibited very severe impairment. Relative to her previous February 2022 evaluation, cognitive decline was exhibited across all assessed cognitive domains. Decline was fairly significant in many cases, suggesting a fairly accelerated rate of change relative to her previous evaluation. No areas saw improvement or consistent stability. Functional decline also seems to have exhibited a fairly rapid decline as Natalie Morrow was not  experiencing prominent ADL dysfunction at the time of her previous evaluation outside of driving concerns. Given evidence for cognitive and functional impairment, she meets diagnostic criteria for a Major Neurocognitive Disorder (dementia) at the present time. Please see her original report for full diagnostic considerations.    A prior PET scan in 2022 revealed decreased cortical metabolism in the left parietal lobe, leading some in her medical records to theorize underlying Alzheimer's disease. While temporal and parietal hypometabolism can be worrisome for this illness, typical metabolic changes are first observed in the temporal regions and later spread to parietal regions in Alzheimer's disease. There is also research to suggest that left parietal metabolic changes in particular are among the regions with the largest metabolic change in healthy individuals (i.e., normal aging process). There remains the potential that this finding represents somewhat of a red herring and that a parkinsonian presentation best fits current data. However, there also remains the potential for a mixed presentation as there can be co-occurring presentations of Alzheimer's disease and Parkinson's disease or another parkinsonian presentation (Alzheimer's disease and Lewy body disease can co-occur around 20-30% of the time for example). It is worth highlighting, given the results of her DaTscan , that Lewy body disease can also have parietal hypometabolism across a PET scan (an occipital/parietal pattern is common).       Initial evaluation 07/28/21  On the evening of 08/04/2020, she did not recognize her husband.  She wasn't agitated but didn't want to sleep in the same bed and asked him to leave the house.  The next morning, she still didn't recognize her husband.  Her son came over and she did recognize her son.  They brought her to the ED.  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  CMP showed normal kidney and liver function with no electrolyte abnormalities.  TSH was 1.687.  WBC on CBC was normal.  UA was positive for UTI.  She was discharged on antibiotics.  B12, folate and RPR reportedly normal.  Over the next several weeks, symptoms slowly improved but have not completely resolved.  She will still sometimes briefly not recognize her husband.  On one occasion, she did not really recognize her son either.  She has still been able to perform all ADLs however she has not been driving since this started.  No headache, fever, cough.  Prior to onset, she had been experiencing dizziness/vertigo.  Also, she appeared to not be as talkative on the phone.  She has been under increased stress caring for her husband who has been having worsening kidney disease and may need to start dialysis.  EEG on 09/25/2020 showed mild left temporal slowing.  She underwent neuropsychological testing on 10/28/2020 showed mild cognitive deficits in memory and executive function but did not meet criteria for dementia.  PET scan of brain on 03/10/2021 showed decreased relative cortical metabolism within the left parietal lobe as may be seen in Alzheimer's type pathology. Mother and maternal aunt dementia. Her sister has been experiencing symptoms as well.  DaTscan  09/29/2022, shows decreased radiotracer activity in the left and right striata in a pattern suggestive of Parkinson syndrome pathology, greater deficit on the left.    MRI brain 07/15/22 No evidence of acute intracranial abnormality.2. Minimal chronic small-vessel ischemic changes within the cerebral white matter, similar to the prior brain MRI of 02/02/2021.3. Mild generalized cerebral atrophy, stable.4. Mild paranasal sinus disease, as described.   PREVIOUS MEDICATIONS: Donepezil , rivastigmine   CURRENT MEDICATIONS:  Outpatient Encounter Medications as of 06/12/2024  Medication Sig    carbidopa -levodopa  (SINEMET  IR) 25-100 MG tablet TAKE ONE TABLET BY MOUTH THREE TIMES DAILY; AT LEAST 30 MINUTES PRIOR TO MEALS.   cholecalciferol (VITAMIN D3) 25 MCG (1000 UNIT) tablet Take 1,000 Units by mouth daily.   Famotidine (PEPCID AC PO) Take by mouth daily.   levothyroxine (SYNTHROID, LEVOTHROID) 50 MCG tablet Take 50 mcg by mouth daily.   [DISCONTINUED] divalproex  (DEPAKOTE ) 125 MG DR tablet Take 2 tabs at night (250 mg) for sundowning   [DISCONTINUED] memantine  (NAMENDA ) 10 MG tablet Take 1 tablet (10 mg total) by mouth 2 (two) times daily.   divalproex  (DEPAKOTE ) 250 MG DR tablet Take 1 tabs at night (250 mg) for sundowning   loratadine (CLARITIN) 10 MG tablet Take 10 mg by mouth daily as needed for allergies.   Probiotic Product (ALIGN) 4 MG CAPS Take 4 mg by mouth 2 (two) times a week. (Patient not taking: Reported on 12/07/2022)   No facility-administered encounter medications on file as of 06/12/2024.       12/07/2022    8:00 PM 06/24/2022   11:46 AM 09/19/2021    8:00 AM  MMSE - Mini Mental State Exam  Orientation to time 2 2 3   Orientation to Place 5 3 5   Registration 3 3 3   Attention/ Calculation 2 5 2   Recall 3 2 2   Language- name 2 objects 2 2 2   Language- repeat 1 1 1   Language- follow 3 step command 3 2 3   Language- read & follow direction 1 1 1   Write a sentence 0 0 0  Copy design 0 0 0  Total score 22 21 22        No data to display          Objective:     PHYSICAL EXAMINATION:    VITALS:   Vitals:   06/12/24 1421  BP: 103/69  Pulse: 95  SpO2: 96%  Weight: 123 lb (55.8 kg)  Height: 5' 6 (1.676 m)    GEN:  The patient appears stated age and is in NAD. HEENT:  Normocephalic, atraumatic.   Neurological examination:  General: NAD, well-groomed, appears stated age. Orientation: The patient is alert. Oriented to person, not to place and date Cranial nerves: There is good facial symmetry.flat affect. The speech is tangential, but clear.   Hypophonia noticed.No aphasia or dysarthria. Fund of knowledge is reduced.  Recent and remote memory are impaired. Attention and concentration are reduced.  Unable to name objects.   unable to repeat phrases.  Hearing is intact to conversational tone. Sensation: Sensation is intact to light touch throughout Motor: Strength is at least antigravity x4. DTR's 2/4 in UE/LE     Movement examination: Tone: There is mildly increased tone in the UE/LE, mild bilateral cogwheeling Abnormal movements: R>L intention tremor.  No myoclonus.  No asterixis.   Coordination:  There is decremation with RAM's.  Abnormal finger to nose  Gait and Station: The patient has difficulty arising out of a deep-seated chair without  the use of the hands needs her husband's help. The patient's stride length is short.  Gait is cautious and narrow.    Thank you for allowing us  the opportunity to participate in the care of this nice patient. Please do not hesitate to contact us  for any questions or concerns.   Total time spent on today's visit was 23 minutes dedicated to this patient today, preparing to see patient, examining the patient, ordering tests and/or medications and counseling the patient, documenting clinical information in the EHR or other health record, independently interpreting results and communicating results to the patient/family, discussing treatment and goals, answering patient's questions and coordinating care.  Cc:  Atilano Deward ORN, MD  Camie Sevin 06/12/2024 5:13 PM

## 2024-06-12 NOTE — Patient Instructions (Signed)
 Continue Carbidopa  levodopa   Discontinue memantine  to 10 mg 2 times a day Continue Depakote , one tablet 250 mg  at night  Follow up in 6 months  Ok with melatonin 3-5 mg, may take up to 10 mg at night   Visit this website: [facility]

## 2024-07-01 DIAGNOSIS — R300 Dysuria: Secondary | ICD-10-CM | POA: Diagnosis not present

## 2024-07-14 NOTE — Progress Notes (Signed)
 Natalie Morrow                                          MRN: [id number]   07/14/2024   The [facility] Quality Team Specialist reviewed this patient medical record for the purposes of chart review for care gap closure. The following were reviewed: chart review for care gap closure-kidney health evaluation for diabetes:eGFR  and uACR.    [facility] Quality Team

## 2024-08-14 DEATH — deceased

## 2024-11-20 ENCOUNTER — Ambulatory Visit: Admitting: Physician Assistant
